# Patient Record
Sex: Female | Born: 1970 | Race: White | Hispanic: No | Smoking: Current every day smoker
Health system: Southern US, Community
[De-identification: ages and names within clinical notes are randomized; demographics above are authoritative.]

## PROBLEM LIST (undated history)

## (undated) DIAGNOSIS — C801 Malignant (primary) neoplasm, unspecified: Secondary | ICD-10-CM

## (undated) DIAGNOSIS — N83209 Unspecified ovarian cyst, unspecified side: Secondary | ICD-10-CM

## (undated) HISTORY — DX: Unspecified ovarian cyst, unspecified side: N83.209

---

## 2017-03-17 ENCOUNTER — Encounter (HOSPITAL_COMMUNITY): Payer: Self-pay | Admitting: Emergency Medicine

## 2017-03-17 ENCOUNTER — Emergency Department (HOSPITAL_COMMUNITY): Payer: Self-pay

## 2017-03-17 ENCOUNTER — Emergency Department (HOSPITAL_COMMUNITY)
Admission: EM | Admit: 2017-03-17 | Discharge: 2017-03-18 | Disposition: A | Discharge status: Home / Self Care | Payer: Self-pay | Attending: Emergency Medicine | Admitting: Emergency Medicine

## 2017-03-17 DIAGNOSIS — Z880 Allergy status to penicillin: Secondary | ICD-10-CM | POA: Insufficient documentation

## 2017-03-17 DIAGNOSIS — M5442 Lumbago with sciatica, left side: Secondary | ICD-10-CM

## 2017-03-17 DIAGNOSIS — X500XXA Overexertion from strenuous movement or load, initial encounter: Secondary | ICD-10-CM | POA: Insufficient documentation

## 2017-03-17 DIAGNOSIS — F17210 Nicotine dependence, cigarettes, uncomplicated: Secondary | ICD-10-CM | POA: Insufficient documentation

## 2017-03-17 DIAGNOSIS — Y929 Unspecified place or not applicable: Secondary | ICD-10-CM | POA: Insufficient documentation

## 2017-03-17 DIAGNOSIS — Y939 Activity, unspecified: Secondary | ICD-10-CM | POA: Insufficient documentation

## 2017-03-17 DIAGNOSIS — N8301 Follicular cyst of right ovary: Secondary | ICD-10-CM | POA: Insufficient documentation

## 2017-03-17 DIAGNOSIS — Y999 Unspecified external cause status: Secondary | ICD-10-CM | POA: Insufficient documentation

## 2017-03-17 DIAGNOSIS — R102 Pelvic and perineal pain: Secondary | ICD-10-CM

## 2017-03-17 DIAGNOSIS — N83201 Unspecified ovarian cyst, right side: Secondary | ICD-10-CM

## 2017-03-17 DIAGNOSIS — S39012A Strain of muscle, fascia and tendon of lower back, initial encounter: Secondary | ICD-10-CM | POA: Insufficient documentation

## 2017-03-17 HISTORY — DX: Malignant (primary) neoplasm, unspecified: C80.1

## 2017-03-17 LAB — CBC
HEMATOCRIT: 44.2 % (ref 36.0–46.0)
HEMOGLOBIN: 14.7 g/dL (ref 12.0–15.0)
MCH: 32.8 pg (ref 26.0–34.0)
MCHC: 33.3 g/dL (ref 30.0–36.0)
MCV: 98.7 fL (ref 78.0–100.0)
Platelets: 153 10*3/uL (ref 150–400)
RBC: 4.48 MIL/uL (ref 3.87–5.11)
RDW: 13.1 % (ref 11.5–15.5)
WBC: 6 10*3/uL (ref 4.0–10.5)

## 2017-03-17 LAB — COMPREHENSIVE METABOLIC PANEL
ALK PHOS: 40 U/L (ref 38–126)
ALT: 27 U/L (ref 14–54)
ANION GAP: 9 (ref 5–15)
AST: 28 U/L (ref 15–41)
Albumin: 4.5 g/dL (ref 3.5–5.0)
BUN: 7 mg/dL (ref 6–20)
CALCIUM: 9.5 mg/dL (ref 8.9–10.3)
CO2: 25 mmol/L (ref 22–32)
Chloride: 105 mmol/L (ref 101–111)
Creatinine, Ser: 0.62 mg/dL (ref 0.44–1.00)
GFR calc non Af Amer: >60 mL/min (ref >60)
Glucose, Bld: 123 mg/dL — ABNORMAL HIGH (ref 65–99)
POTASSIUM: 3.6 mmol/L (ref 3.5–5.1)
SODIUM: 139 mmol/L (ref 135–145)
TOTAL PROTEIN: 6.7 g/dL (ref 6.5–8.1)
Total Bilirubin: 0.5 mg/dL (ref 0.3–1.2)

## 2017-03-17 LAB — URINALYSIS, ROUTINE W REFLEX MICROSCOPIC
Bilirubin Urine: NEGATIVE
Glucose, UA: NEGATIVE mg/dL
Hgb urine dipstick: NEGATIVE
Ketones, ur: NEGATIVE mg/dL
Leukocytes, UA: NEGATIVE
NITRITE: NEGATIVE
Protein, ur: NEGATIVE mg/dL
SPECIFIC GRAVITY, URINE: 1.008 (ref 1.005–1.030)
pH: 8 (ref 5.0–8.0)

## 2017-03-17 LAB — PREGNANCY, URINE: PREG TEST UR: NEGATIVE

## 2017-03-17 LAB — LIPASE, BLOOD: Lipase: 47 U/L (ref 11–51)

## 2017-03-17 MED ORDER — OXYCODONE-ACETAMINOPHEN 5-325 MG PO TABS
ORAL_TABLET | ORAL | Status: AC
  Filled 2017-03-17: qty 1

## 2017-03-17 MED ORDER — IOPAMIDOL (ISOVUE-300) INJECTION 61%
INTRAVENOUS | Status: AC
  Administered 2017-03-17: 100 mL
  Filled 2017-03-17: qty 100

## 2017-03-17 MED ORDER — IBUPROFEN 800 MG PO TABS: 800.0000 mg | ORAL_TABLET | Freq: Three times a day (TID) | ORAL | 0 refills | Status: DC | PRN

## 2017-03-17 MED ORDER — MORPHINE SULFATE (PF) 4 MG/ML IV SOLN
4.0000 mg | Freq: Once | INTRAVENOUS | Status: AC
  Administered 2017-03-17: 4 mg via INTRAVENOUS
  Filled 2017-03-17: qty 1

## 2017-03-17 MED ORDER — HYDROCODONE-ACETAMINOPHEN 5-325 MG PO TABS: 1.0000 | ORAL_TABLET | Freq: Four times a day (QID) | ORAL | 0 refills | Status: DC | PRN

## 2017-03-17 MED ORDER — DIAZEPAM 5 MG PO TABS
5.0000 mg | ORAL_TABLET | Freq: Once | ORAL | Status: AC
  Administered 2017-03-17: 5 mg via ORAL
  Filled 2017-03-17: qty 1

## 2017-03-17 MED ORDER — DIAZEPAM 5 MG PO TABS: 5.0000 mg | ORAL_TABLET | Freq: Three times a day (TID) | ORAL | 0 refills | Status: DC | PRN

## 2017-03-17 MED ORDER — OXYCODONE-ACETAMINOPHEN 5-325 MG PO TABS
1.0000 | ORAL_TABLET | Freq: Once | ORAL | Status: AC
  Administered 2017-03-17: 1 via ORAL

## 2017-03-17 MED ORDER — SODIUM CHLORIDE 0.9 % IV BOLUS (SEPSIS)
1000.0000 mL | Freq: Once | INTRAVENOUS | Status: AC
  Administered 2017-03-17: 1000 mL via INTRAVENOUS

## 2017-03-17 MED ORDER — HYDROMORPHONE HCL 1 MG/ML IJ SOLN
1.0000 mg | Freq: Once | INTRAMUSCULAR | Status: AC
  Administered 2017-03-17: 1 mg via INTRAVENOUS
  Filled 2017-03-17: qty 1

## 2017-03-17 NOTE — ED Notes (Signed)
Friend stated, My friend is in terrible pain. Can you get her something for pain.

## 2017-03-17 NOTE — ED Triage Notes (Signed)
Pt. Stated, Im having lower back pain and lower abdominal pain started 3-4 days ago. No other symptoms.

## 2017-03-17 NOTE — ED Notes (Signed)
Pt states she has had some incontinence of bladder.  Tender to lower back on palpation. States she has had hysterectomy. Pt states no change from the pain medication given

## 2017-03-17 NOTE — ED Notes (Signed)
Patient transported to Ultrasound 

## 2017-03-17 NOTE — ED Notes (Signed)
Called lab to add urine pregnancy

## 2017-03-17 NOTE — ED Provider Notes (Signed)
[facility] DEPT Provider Note   CSN: [id number] Arrival date & time: 03/17/17  1021     History   Chief Complaint Chief Complaint  Patient presents with  . Back Pain  . Abdominal Pain    HPI Norma Pierce is a 46 y.o. female.  HPI Patient presents to the emergency department with back pain and lower abdominal pain.  The patient, states she has feels very anxious as well.  She states that nothing seems make the condition better with movement and palpation make the pain worse.  She states that she did not take any medications prior to arrival.  She states the symptoms started 3 days ago. The patient denies chest pain, shortness of breath, headache,blurred vision, neck pain, fever, cough, weakness, numbness, dizziness, anorexia, edema, nausea, vomiting, diarrhea, rash,  dysuria, hematemesis, bloody stool, near syncope, or syncope. Past Medical History:  Diagnosis Date  . Cancer ([place])     There are no active problems to display for this patient.   History reviewed. No pertinent surgical history.  OB History    No data available       Home Medications    Prior to Admission medications   Medication Sig Start Date End Date Taking? Authorizing Provider  acetaminophen (TYLENOL) 500 MG tablet Take 1,000 mg by mouth every 4 (four) hours as needed (for pain).   Yes [provider]  docusate sodium (COLACE) 100 MG capsule Take 100 mg by mouth at bedtime as needed for mild constipation.   Yes [provider]  ferrous sulfate 325 (65 FE) MG tablet Take 325 mg by mouth daily with breakfast.   Yes [provider]  Magnesium Salicylate Tetrahyd (DOANS EXTRA STRENGTH PO) Take 2 tablets by mouth every 6 (six) hours as needed (for pain).   Yes [provider]  diazepam (VALIUM) 5 MG tablet Take 1 tablet (5 mg total) by mouth every 8 (eight) hours as needed for muscle spasms. 03/17/17   Selenia Mihok, Harrell Gave, PA-C  HYDROcodone-acetaminophen  (NORCO/VICODIN) 5-325 MG tablet Take 1 tablet by mouth every 6 (six) hours as needed for moderate pain. 03/17/17   Yasamin Karel, Harrell Gave, PA-C  ibuprofen (ADVIL,MOTRIN) 800 MG tablet Take 1 tablet (800 mg total) by mouth every 8 (eight) hours as needed. 03/17/17   Dalia Heading, PA-C    Family History No family history on file.  Social History Social History  Substance Use Topics  . Smoking status: Current Every Day Smoker  . Smokeless tobacco: Current User  . Alcohol use No     Allergies   Penicillins   Review of Systems Review of Systems  All other systems negative except as documented in the HPI. All pertinent positives and negatives as reviewed in the HPI. Physical Exam Updated Vital Signs BP (!) 142/113   Pulse (!) 58   Temp 98.3 F (36.8 C) (Oral)   Resp 18   Ht 5' 3.5" (1.613 m)   Wt 41.3 kg (91 lb)   SpO2 100%   BMI 15.87 kg/m   Physical Exam  Constitutional: She is oriented to person, place, and time. She appears well-developed and well-nourished. No distress.  HENT:  Head: Normocephalic and atraumatic.  Mouth/Throat: Oropharynx is clear and moist.  Eyes: Pupils are equal, round, and reactive to light.  Neck: Normal range of motion. Neck supple.  Cardiovascular: Normal rate, regular rhythm and normal heart sounds.  Exam reveals no gallop and no friction rub.   No murmur heard. Pulmonary/Chest: Effort normal and  breath sounds normal. No respiratory distress. She has no wheezes.  Abdominal: Soft. Bowel sounds are normal. She exhibits no distension and no mass. There is tenderness. There is no rebound and no guarding.  Musculoskeletal:       Lumbar back: She exhibits tenderness. She exhibits normal range of motion, no bony tenderness and no swelling.       Back:  Neurological: She is alert and oriented to person, place, and time. She has normal strength and normal reflexes. No sensory deficit. She exhibits normal muscle tone. Coordination and gait normal. GCS  eye subscore is 4. GCS verbal subscore is 5. GCS motor subscore is 6.  Skin: Skin is warm and dry. Capillary refill takes less than 2 seconds. No rash noted. No erythema.  Psychiatric: She has a normal mood and affect. Her behavior is normal.  Nursing note and vitals reviewed.    ED Treatments / Results  Labs (all labs ordered are listed, but only abnormal results are displayed) Labs Reviewed  COMPREHENSIVE METABOLIC PANEL - Abnormal; Notable for the following:       Result Value   Glucose, Bld 123 (*)    All other components within normal limits  URINALYSIS, ROUTINE W REFLEX MICROSCOPIC - Abnormal; Notable for the following:    Color, Urine STRAW (*)    All other components within normal limits  LIPASE, BLOOD  CBC  PREGNANCY, URINE    EKG  EKG Interpretation None       Radiology US Transvaginal Non-ob  Result Date: 03/17/2017 CLINICAL DATA:  Pelvic pain for 4 days. Status post hysterectomy 2012. Follow-up cystic structure and pelvis. EXAM: TRANSABDOMINAL AND TRANSVAGINAL ULTRASOUND OF PELVIS DOPPLER ULTRASOUND OF OVARIES TECHNIQUE: Both transabdominal and transvaginal ultrasound examinations of the pelvis were performed. Transabdominal technique was performed for global imaging of the pelvis including ovaries, adnexal regions, and pelvic cul-de-sac. It was necessary to proceed with endovaginal exam following the transabdominal exam to visualize the adnexa. Color and duplex Doppler ultrasound was utilized to evaluate blood flow to the ovaries. COMPARISON:  CT abdomen and pelvis March 17, 2017 1841 hours FINDINGS: Uterus Surgically absent. Right ovary Measurements: 2.5 x 1.8 x 1.7 cm. Isoechoic 13 x 17 mm intra ovarian mass with increased through transmission, no vascularity. Left ovary Not sonographically identified, likely obscured by bowel gas. Pulsed Doppler evaluation of RIGHT ovary demonstrates normal low-resistance arterial and venous waveforms. Other findings Tubular anechoic  thin walled structure adjacent to RIGHT ovary measuring 4.2 x 3 x 3.8 cm without inflammation. IMPRESSION: 1. Tubular cystic structure RIGHT pelvis seen with hydrosalpinx, less likely pyosalpinx given lack of debris and inflammation. 2. 1.7 cm RIGHT ovarian hemorrhagic cyst versus endometrium. Recommend follow-up pelvic ultrasound in 6-12 weeks to ensure resolution. Electronically Signed   By: Elon Alas M.D.   On: 03/17/2017 21:58   US Pelvis Complete  Result Date: 03/17/2017 CLINICAL DATA:  Pelvic pain for 4 days. Status post hysterectomy 2012. Follow-up cystic structure and pelvis. EXAM: TRANSABDOMINAL AND TRANSVAGINAL ULTRASOUND OF PELVIS DOPPLER ULTRASOUND OF OVARIES TECHNIQUE: Both transabdominal and transvaginal ultrasound examinations of the pelvis were performed. Transabdominal technique was performed for global imaging of the pelvis including ovaries, adnexal regions, and pelvic cul-de-sac. It was necessary to proceed with endovaginal exam following the transabdominal exam to visualize the adnexa. Color and duplex Doppler ultrasound was utilized to evaluate blood flow to the ovaries. COMPARISON:  CT abdomen and pelvis March 17, 2017 1841 hours FINDINGS: Uterus Surgically absent. Right ovary Measurements: 2.5  x 1.8 x 1.7 cm. Isoechoic 13 x 17 mm intra ovarian mass with increased through transmission, no vascularity. Left ovary Not sonographically identified, likely obscured by bowel gas. Pulsed Doppler evaluation of RIGHT ovary demonstrates normal low-resistance arterial and venous waveforms. Other findings Tubular anechoic thin walled structure adjacent to RIGHT ovary measuring 4.2 x 3 x 3.8 cm without inflammation. IMPRESSION: 1. Tubular cystic structure RIGHT pelvis seen with hydrosalpinx, less likely pyosalpinx given lack of debris and inflammation. 2. 1.7 cm RIGHT ovarian hemorrhagic cyst versus endometrium. Recommend follow-up pelvic ultrasound in 6-12 weeks to ensure resolution.  Electronically Signed   By: Elon Alas M.D.   On: 03/17/2017 21:58   Ct Abdomen Pelvis W Contrast  Result Date: 03/17/2017 CLINICAL DATA:  Low back and lower abdominal pain starting 3-4 days ago. EXAM: CT ABDOMEN AND PELVIS WITH CONTRAST TECHNIQUE: Multidetector CT imaging of the abdomen and pelvis was performed using the standard protocol following bolus administration of intravenous contrast. CONTRAST:  114mL ISOVUE-300 IOPAMIDOL (ISOVUE-300) INJECTION 61% COMPARISON:  None. FINDINGS: Lower chest:  No evidence pleural effusion. Hepatobiliary: Several very tiny hypoattenuating liver lesions are too small to characterize but likely benign etiology such as cysts. There is no evidence for gallstones, gallbladder wall thickening, or pericholecystic fluid. No intrahepatic or extrahepatic biliary dilation. Pancreas: No focal mass lesion. No dilatation of the main duct. No intraparenchymal cyst. No peripancreatic edema. Spleen: No splenomegaly. No focal mass lesion. Adrenals/Urinary Tract: No adrenal nodule or mass. Kidneys are unremarkable. No evidence for hydroureter. The urinary bladder appears normal for the degree of distention. Stomach/Bowel: Stomach is nondistended. No gastric wall thickening. No evidence of outlet obstruction. Duodenum is normally positioned as is the ligament of Treitz. No small bowel wall thickening. No small bowel dilatation. The terminal ileum is normal. The appendix is not visualized, but there is no edema or inflammation in the region of the cecum. No gross colonic mass. No colonic wall thickening. No substantial diverticular change. Suture line is noted in the distal rectum near the anal verge. Vascular/Lymphatic: No abdominal aortic aneurysm. There is no gastrohepatic or hepatoduodenal ligament lymphadenopathy. No intraperitoneal or retroperitoneal lymphadenopathy. No pelvic sidewall lymphadenopathy. Reproductive: Uterus is surgically absent. Dilated fluid-filled tubular  structure in the right adnexal region measures up to 2.1 cm diameter. This has a somewhat tortuous configuration and is immediately adjacent to and contiguous with the normal appearing right ovary. Imaging features are most suggestive of a dilated right fallopian tube. Left ovary is normal. Other: There is a trace amount of free fluid in the cul-de-sac. Musculoskeletal: Bone windows reveal no worrisome lytic or sclerotic osseous lesions. IMPRESSION: 1. Dilated fluid-filled tubular structure with tortuous configuration in the right adnexal space is felt to be most consistent with a dilated right fallopian tube (hydrosalpinx). Superinfection of this fluid cannot be excluded by CT (pyosalpinx). Although the appendix is not seen on today's study, this structure has a thinner wall than typically seen for an appendix and there is no adjacent edema or inflammation. The adjacent right ovary is normal by CT. No evidence for abscess. 2. Trace free fluid in the cul-de-sac. 3. Anastomotic suture line noted in the low rectum near the anal verge. Electronically Signed   By: Misty Stanley M.D.   On: 03/17/2017 18:58   [place] Art/ven Flow Abd Pelv Doppler  Result Date: 03/17/2017 CLINICAL DATA:  Pelvic pain for 4 days. Status post hysterectomy 2012. Follow-up cystic structure and pelvis. EXAM: TRANSABDOMINAL AND TRANSVAGINAL ULTRASOUND OF PELVIS DOPPLER ULTRASOUND  OF OVARIES TECHNIQUE: Both transabdominal and transvaginal ultrasound examinations of the pelvis were performed. Transabdominal technique was performed for global imaging of the pelvis including ovaries, adnexal regions, and pelvic cul-de-sac. It was necessary to proceed with endovaginal exam following the transabdominal exam to visualize the adnexa. Color and duplex Doppler ultrasound was utilized to evaluate blood flow to the ovaries. COMPARISON:  CT abdomen and pelvis March 17, 2017 1841 hours FINDINGS: Uterus Surgically absent. Right ovary Measurements: 2.5 x 1.8 x 1.7  cm. Isoechoic 13 x 17 mm intra ovarian mass with increased through transmission, no vascularity. Left ovary Not sonographically identified, likely obscured by bowel gas. Pulsed Doppler evaluation of RIGHT ovary demonstrates normal low-resistance arterial and venous waveforms. Other findings Tubular anechoic thin walled structure adjacent to RIGHT ovary measuring 4.2 x 3 x 3.8 cm without inflammation. IMPRESSION: 1. Tubular cystic structure RIGHT pelvis seen with hydrosalpinx, less likely pyosalpinx given lack of debris and inflammation. 2. 1.7 cm RIGHT ovarian hemorrhagic cyst versus endometrium. Recommend follow-up pelvic ultrasound in 6-12 weeks to ensure resolution. Electronically Signed   By: Elon Alas M.D.   On: 03/17/2017 21:58    Procedures Procedures (including critical care time)  Medications Ordered in ED Medications  oxyCODONE-acetaminophen (PERCOCET/ROXICET) 5-325 MG per tablet 1 tablet (1 tablet Oral Given 03/17/17 1259)  sodium chloride 0.9 % bolus 1,000 mL (0 mLs Intravenous Stopped 03/17/17 2010)  morphine 4 MG/ML injection 4 mg (4 mg Intravenous Given 03/17/17 1856)  iopamidol (ISOVUE-300) 61 % injection (100 mLs  Contrast Given 03/17/17 1831)  HYDROmorphone (DILAUDID) injection 1 mg (1 mg Intravenous Given 03/17/17 2050)  diazepam (VALIUM) tablet 5 mg (5 mg Oral Given 03/17/17 2054)     Initial Impression / Assessment and Plan / ED Course  I have reviewed the triage vital signs and the nursing notes.  Pertinent labs & imaging results that were available during my care of the patient were reviewed by me and considered in my medical decision making (see chart for details).     , And has an ovarian cyst and hydrosalpinx.  She also has a lot of feels a muscular strain of her back due to the fact that is palpable in nature, not in the midline.  Patient has no recent trauma.  Explained the importance of follow-up with the GYN doctor.  The patient agrees the plan and all questions were  answered.  Patient is advised to return here for any worsening in her condition.  I did advise her that this could be an evolving process.    She seems very anxious on my examination, she reacts very boisterously to the blood pressure cuff inflating on her arm.  Patient is feeling better following pain medications advised her to feel this is multifactorial and told to return here as needed  Final Clinical Impressions(s) / ED Diagnoses   Final diagnoses:  Cyst of right ovary  Pelvic pain in female  Acute left-sided low back pain with left-sided sciatica  Strain of lumbar region, initial encounter    New Prescriptions New Prescriptions   DIAZEPAM (VALIUM) 5 MG TABLET    Take 1 tablet (5 mg total) by mouth every 8 (eight) hours as needed for muscle spasms.   HYDROCODONE-ACETAMINOPHEN (NORCO/VICODIN) 5-325 MG TABLET    Take 1 tablet by mouth every 6 (six) hours as needed for moderate pain.   IBUPROFEN (ADVIL,MOTRIN) 800 MG TABLET    Take 1 tablet (800 mg total) by mouth every 8 (eight) hours as needed.  Dalia Heading, PA-C 69/45/03 [id number]    Delora Fuel, MD 80/03/49 [phone]

## 2017-03-17 NOTE — Discharge Instructions (Signed)
Use ice and heat on your back.  Return here as needed.  Follow-up with the clinic provided

## 2017-03-17 NOTE — ED Notes (Signed)
Pt stated, at least I can breath.

## 2017-03-18 ENCOUNTER — Telehealth: Payer: Self-pay | Admitting: Emergency Medicine

## 2017-03-18 NOTE — Telephone Encounter (Signed)
Pt's spouse, Norma Pierce called with concern for continued pain after being seen at [facility] last night.  Reviewed chart, noting pt was discharged with pain medications and has a follow up appointment scheduled for March 20, 2017 at 1350.  Returned call and spoke with Norma Pierce who stated they filled the prescriptions but pt is continuing to have pain and he was unable to get a follow up appointment in the next several days.  Advised him an appointment was scheduled and to call them to confirm.  Discussed medications and advised to also contact the pharmacist if he has concerns for medication interactions.  No additional CM needs noted at this time.

## 2017-03-20 ENCOUNTER — Encounter: Payer: Self-pay | Admitting: Obstetrics & Gynecology

## 2017-03-21 ENCOUNTER — Encounter: Payer: Self-pay | Admitting: Obstetrics and Gynecology

## 2017-03-21 ENCOUNTER — Ambulatory Visit (INDEPENDENT_AMBULATORY_CARE_PROVIDER_SITE_OTHER): Payer: Self-pay | Admitting: Obstetrics and Gynecology

## 2017-03-21 VITALS — BP 128/88 | Ht 63.0 in | Wt 86.0 lb

## 2017-03-21 DIAGNOSIS — N7011 Chronic salpingitis: Secondary | ICD-10-CM

## 2017-03-21 DIAGNOSIS — N83201 Unspecified ovarian cyst, right side: Secondary | ICD-10-CM

## 2017-03-21 DIAGNOSIS — Z8541 Personal history of malignant neoplasm of cervix uteri: Secondary | ICD-10-CM | POA: Insufficient documentation

## 2017-03-21 NOTE — Progress Notes (Addendum)
Obstetrics & Gynecology Office Visit   Chief Complaint  Patient presents with  . Follow-up    ER Follow Up   History of Present Illness: 46 y.o. [id number] female who presents in follow up from an ER visit to [facility] on 03/17/17.  She states that she had intercourse on last Thursday (8 days ago) night and when she woke up on Friday morning she had a lot of left back pain. This pain continued through the weekend and on Sunday she could barely move. Therefore, on Monday she presented to the emergency room. Her workup was notable for extreme tenderness along her left paraspinous muscles. A CT of the abdomen and pelvis showed a right pelvic tubular structure, possibly consistent with a hydrosalpinx. A pelvic ultrasound confirmed the same and showed a right ovarian cyst with maximal diameter of 1.7 cm. She was diagnosed also with the urinary tract infection and given appropriate antibiotics.  The patient has a somewhat confusing history. She states that in 2012 she underwent a hysterectomy due to cervical cancer. It has been her understanding that she had all of her reproductive organs removed. This includes her uterus, her cervix, her ovaries, and both fallopian tubes. Shortly after her surgery she moved to a different part of [place]. She states that she had follow-up with a gynecologist near [place]. She has only recently moved to this area. She states that she has been doing a lot of work on a new home. She also states that in addition to her back pain she has pain in her bilateral groin area.  She denies any vaginal bleeding. She states that she has lost about 10 pounds unintentionally. Denies bloating, constipation, early satiety. Her current pain is of acute onset. She states that she saw her primary care doctor this morning and was given medication for muscle spasms, which she states has helped somewhat.  Past Medical History:  Diagnosis Date  . Cancer ([place])   . Ovarian cyst    Past  Surgical History: total laparoscopic hysterectomy, bilateral salpingo-oophorectomy, 2012 due to cervical cancer.  Gynecologic History: No LMP recorded. Patient has had a hysterectomy.  Obstetric History: [id number]  Family History  Problem Relation Age of Onset  . Lung cancer Mother   . Diabetes Mellitus II Sister   . Skin cancer Sister   . Breast cancer Maternal Aunt   . Hypertension Maternal Aunt   . COPD Maternal Aunt   . Skin cancer Maternal Aunt   . Fibromyalgia Maternal Aunt   . Brain cancer Maternal Uncle   . Aneurysm Maternal Grandmother   . Heart attack Maternal Grandfather     Social History   Social History  . Marital status: Married    Spouse name: N/A  . Number of children: N/A  . Years of education: N/A   Occupational History  . Not on file.   Social History Main Topics  . Smoking status: Current Every Day Smoker  . Smokeless tobacco: Current User  . Alcohol use No  . Drug use: No  . Sexual activity: Not Currently   Other Topics Concern  . Not on file   Social History Narrative  . No narrative on file    Allergies  Allergen Reactions  . Penicillins Anaphylaxis and Hives    Has patient had a PCN reaction causing immediate rash, facial/tongue/throat swelling, SOB or lightheadedness with hypotension: Yes Has patient had a PCN reaction causing severe rash involving mucus membranes or skin necrosis: No Has patient  had a PCN reaction that required hospitalization: Yes Has patient had a PCN reaction occurring within the last 10 years: No If all of the above answers are "NO", then may proceed with Cephalosporin use.     Prior to Admission medications   Medication Sig Start Date End Date Taking? Authorizing Provider  acetaminophen (TYLENOL) 500 MG tablet Take 1,000 mg by mouth every 4 (four) hours as needed (for pain).    [provider]  diazepam (VALIUM) 5 MG tablet Take 1 tablet (5 mg total) by mouth every 8 (eight) hours as needed for muscle  spasms. 03/17/17   Lawyer, Harrell Gave, PA-C  docusate sodium (COLACE) 100 MG capsule Take 100 mg by mouth at bedtime as needed for mild constipation.    [provider]  ferrous sulfate 325 (65 FE) MG tablet Take 325 mg by mouth daily with breakfast.    [provider]  HYDROcodone-acetaminophen (NORCO/VICODIN) 5-325 MG tablet Take 1 tablet by mouth every 6 (six) hours as needed for moderate pain. 03/17/17   Lawyer, Harrell Gave, PA-C  ibuprofen (ADVIL,MOTRIN) 800 MG tablet Take 1 tablet (800 mg total) by mouth every 8 (eight) hours as needed. 03/17/17   Lawyer, Harrell Gave, PA-C  Magnesium Salicylate Tetrahyd (DOANS EXTRA STRENGTH PO) Take 2 tablets by mouth every 6 (six) hours as needed (for pain).    [provider]    Review of Systems  Constitutional: Positive for weight loss. Negative for chills, diaphoresis, fever and malaise/fatigue.  HENT: Negative.   Eyes: Negative.   Respiratory: Negative.  Negative for cough, hemoptysis and wheezing.   Cardiovascular: Negative.  Negative for leg swelling.  Gastrointestinal: Positive for abdominal pain (as noted in the HPI). Negative for blood in stool, constipation, diarrhea, melena, nausea and vomiting.  Genitourinary: Positive for dysuria and flank pain. Negative for frequency, hematuria and urgency.  Musculoskeletal: Positive for back pain. Negative for falls, joint pain and neck pain.  Skin: Negative.   Neurological: Negative.  Negative for weakness.  Psychiatric/Behavioral: Negative.     Physical Exam BP 128/88   Ht 5\' 3"  (1.6 m)   Wt 86 lb (39 kg)   BMI 15.23 kg/m  No LMP recorded. Patient has had a hysterectomy. Physical Exam  Constitutional: She is oriented to person, place, and time.  Thin woman, in no apparent distress  Genitourinary: Vagina normal. Pelvic exam was performed with patient supine. There is no rash, tenderness, lesion or injury on the right labia. There is no rash, tenderness, lesion or injury on  the left labia. Vagina exhibits no lesion. No erythema, tenderness or bleeding in the vagina. No signs of injury around the vagina.  Right adnexum displays tenderness. Right adnexum does not display mass and does not display fullness.  Left adnexum displays tenderness. Left adnexum does not display mass and does not display fullness.  Genitourinary Comments: Speculum exam shows normal vagina with estrogen effect. No cervix visualized. No lesions noted.  TTP in bilateral adnexa. No solid masses palpated.  HENT:  Head: Normocephalic and atraumatic.  Eyes: EOM are normal. No scleral icterus.  Neck: Normal range of motion. Neck supple. No thyromegaly present.  Cardiovascular: Normal rate and regular rhythm.  Exam reveals no gallop and no friction rub.   No murmur heard. Pulmonary/Chest: Effort normal and breath sounds normal. No respiratory distress. She has no wheezes. She has no rales.  Abdominal: Soft. Bowel sounds are normal. She exhibits no distension and no mass. There is tenderness (in bilatral inguinal ligaments). There  is no rebound and no guarding. No hernia.  Musculoskeletal: Normal range of motion. She exhibits tenderness (left paraspinous muslces from mid thoracic region to ilium). She exhibits no edema.  Lymphadenopathy:    She has no cervical adenopathy.  Neurological: She is alert and oriented to person, place, and time. No cranial nerve deficit.  Skin: Skin is warm and dry. No erythema.  Psychiatric: She has a normal mood and affect. Her behavior is normal. Judgment normal.   Female chaperone present for pelvic and breast  portions of the physical exam  Assessment: 46 y.o. [id number] female with back pain and likely sciatic pain with bilateral inguinal pain.  Plan: Since her history is unclear, I would like to try to obtain documentation from [facility] in [place]. Based on her imaging at the emergency room, it appears she has at least the right ovary and possibly the  remnant of a right fallopian tube. It is also possible that the tubular structure seen on CT and ultrasound is a peritoneal inclusion cyst. However given that ovarian tissue was seen on both imaging modalities, the diagnosis of cervical cancer is called into question. If she does have cervical cancer, a sudden onset of pain along with unexplained weight loss gives me concerned for metastatic recurrent disease. I have performed a Pap smear to ensure there is no vaginal, microscopic evidence of recurrent disease. However, given that she had positive findings on ultrasound that appear benign, I will repeat the ultrasound in 2-3 months. Follow-up could change if review of her records from [place] indicate cancer. Treatment for pain per her PCP.  Prentice Docker, MD 03/21/2017 2:14 PM    ADDENDUM: Pap smear result is normal.

## 2017-03-27 LAB — PAP IG (IMAGE GUIDED): PAP SMEAR COMMENT: 0

## 2017-04-09 ENCOUNTER — Ambulatory Visit: Payer: Self-pay | Admitting: Obstetrics and Gynecology

## 2017-04-09 ENCOUNTER — Other Ambulatory Visit: Payer: Self-pay

## 2017-04-24 ENCOUNTER — Ambulatory Visit (INDEPENDENT_AMBULATORY_CARE_PROVIDER_SITE_OTHER): Payer: Self-pay | Admitting: Obstetrics and Gynecology

## 2017-04-24 ENCOUNTER — Ambulatory Visit (INDEPENDENT_AMBULATORY_CARE_PROVIDER_SITE_OTHER): Payer: Self-pay

## 2017-04-24 ENCOUNTER — Encounter: Payer: Self-pay | Admitting: Obstetrics and Gynecology

## 2017-04-24 VITALS — BP 108/68 | Wt 89.0 lb

## 2017-04-24 DIAGNOSIS — N7011 Chronic salpingitis: Secondary | ICD-10-CM

## 2017-04-24 DIAGNOSIS — N83201 Unspecified ovarian cyst, right side: Secondary | ICD-10-CM

## 2017-04-24 DIAGNOSIS — Z8541 Personal history of malignant neoplasm of cervix uteri: Secondary | ICD-10-CM

## 2017-04-24 NOTE — Progress Notes (Signed)
Gynecology Ultrasound Follow Up   Chief Complaint  Patient presents with  . Follow-up  pain and right para-ovarian cyst   History of Present Illness: Patient is a 46 y.o. female who presents today for ultrasound evaluation of right adnexal process.  Ultrasound demonstrates the following findings Adnexa: right tubular structure 1.76 x 2.11 cm, right ovary visualized with possible endometrioma vs cystic structure measuring 1.95 x 1.5 cm Uterus: absent Additional: absent pelvic free fluid  Patient reports improvement in pain symptoms.   Past Medical History:  Diagnosis Date  . Cancer ([place])   . Ovarian cyst    Past Surgical History: total laparoscopic hysterectomy, bilateral salpingo-oophrectomy due to cervical cancer, lysis of adhesions  Family History  Problem Relation Age of Onset  . Lung cancer Mother   . Diabetes Mellitus II Sister   . Skin cancer Sister   . Breast cancer Maternal Aunt   . Hypertension Maternal Aunt   . COPD Maternal Aunt   . Skin cancer Maternal Aunt   . Fibromyalgia Maternal Aunt   . Brain cancer Maternal Uncle   . Aneurysm Maternal Grandmother   . Heart attack Maternal Grandfather     Social History   Social History  . Marital status: Married    Spouse name: N/A  . Number of children: N/A  . Years of education: N/A   Occupational History  . Not on file.   Social History Main Topics  . Smoking status: Current Every Day Smoker  . Smokeless tobacco: Current User  . Alcohol use No  . Drug use: No  . Sexual activity: Not Currently   Other Topics Concern  . Not on file   Social History Narrative  . No narrative on file    Allergies  Allergen Reactions  . Penicillins Anaphylaxis and Hives    Has patient had a PCN reaction causing immediate rash, facial/tongue/throat swelling, SOB or lightheadedness with hypotension: Yes Has patient had a PCN reaction causing severe rash involving mucus membranes or skin necrosis: No Has patient  had a PCN reaction that required hospitalization: Yes Has patient had a PCN reaction occurring within the last 10 years: No If all of the above answers are "NO", then may proceed with Cephalosporin use.     Prior to Admission medications   Medication Sig Start Date End Date Taking? Authorizing Provider  acetaminophen (TYLENOL) 500 MG tablet Take 1,000 mg by mouth every 4 (four) hours as needed (for pain).    [provider]  diazepam (VALIUM) 5 MG tablet Take 1 tablet (5 mg total) by mouth every 8 (eight) hours as needed for muscle spasms. 03/17/17   Lawyer, Harrell Gave, PA-C  docusate sodium (COLACE) 100 MG capsule Take 100 mg by mouth at bedtime as needed for mild constipation.    [provider]  ferrous sulfate 325 (65 FE) MG tablet Take 325 mg by mouth daily with breakfast.    [provider]  HYDROcodone-acetaminophen (NORCO/VICODIN) 5-325 MG tablet Take 1 tablet by mouth every 6 (six) hours as needed for moderate pain. 03/17/17   Lawyer, Harrell Gave, PA-C  ibuprofen (ADVIL,MOTRIN) 800 MG tablet Take 1 tablet (800 mg total) by mouth every 8 (eight) hours as needed. 03/17/17   Lawyer, Harrell Gave, PA-C  Magnesium Salicylate Tetrahyd (DOANS EXTRA STRENGTH PO) Take 2 tablets by mouth every 6 (six) hours as needed (for pain).    [provider]    Physical Exam BP 108/68   Wt 89 lb (40.4 kg)  BMI 15.77 kg/m    General: NAD HEENT: normocephalic, anicteric Pulmonary: No increased work of breathing Extremities: no edema, erythema, or tenderness Neurologic: Grossly intact, normal gait Psychiatric: mood appropriate, affect full   Assessment: 47 y.o. [id number] with possible history of cervical cancer status post TLH/BSO with right hydrosalpinx and right ovarian cyst.  Pain improved. Still no records from [place].   Plan: Problem List Items Addressed This Visit    Hydrosalpinx - Primary   Right ovarian cyst   History of cervical cancer     Discussed  these findings in the setting of possible history of cervical cancer.  Given her age and the timing of her surgery, I would have expected to her to have no ovary on the right side nor a fallopian tube. She further states that in [facility] she had another surgery for lysis of adhesions.  I have still not received records from either place.  Discussed that if she truly had cancer, then I might consider referring her to gyn oncology. Otherwise, we could continue to watch this very small process.  Given her extensive surgical history, I would not want to operate unless absolutely necessary.  Her hydrosalpinx is more like a result of closed fallopian tube ends than an infectious process given her surgical history.  Follow up prn or, if after review of records (She states that she will retrieve them herself) it is determined she needs further workup, consider referral to gynecologic oncology.   20 minutes spent in face to face discussion with > 50% spent in counseling and management of her right hydrosalpinx and right ovarian follicle vs cyst.   Prentice Docker, MD 04/25/2017 2:54 PM

## 2019-03-10 ENCOUNTER — Telehealth: Payer: Self-pay | Admitting: *Deleted

## 2019-03-10 ENCOUNTER — Other Ambulatory Visit: Payer: Self-pay

## 2019-03-10 DIAGNOSIS — Z20822 Contact with and (suspected) exposure to covid-19: Secondary | ICD-10-CM

## 2019-03-10 NOTE — Telephone Encounter (Signed)
Referred by [facility] due to exposure. Appointment made for today at [facility] site for 1:30p Informed to wear mask and stay in vehicle.

## 2019-03-17 LAB — NOVEL CORONAVIRUS, NAA: SARS-CoV-2, NAA: NOT DETECTED

## 2020-03-12 ENCOUNTER — Emergency Department
Admission: EM | Admit: 2020-03-12 | Discharge: 2020-03-12 | Disposition: A | Discharge status: Home / Self Care | Payer: Medicaid Other | Attending: Emergency Medicine | Admitting: Emergency Medicine

## 2020-03-12 ENCOUNTER — Other Ambulatory Visit: Payer: Self-pay

## 2020-03-12 ENCOUNTER — Emergency Department: Payer: Medicaid Other

## 2020-03-12 ENCOUNTER — Encounter: Payer: Self-pay | Admitting: Emergency Medicine

## 2020-03-12 DIAGNOSIS — Y999 Unspecified external cause status: Secondary | ICD-10-CM | POA: Insufficient documentation

## 2020-03-12 DIAGNOSIS — Y939 Activity, unspecified: Secondary | ICD-10-CM | POA: Diagnosis not present

## 2020-03-12 DIAGNOSIS — S0992XA Unspecified injury of nose, initial encounter: Secondary | ICD-10-CM | POA: Diagnosis present

## 2020-03-12 DIAGNOSIS — S022XXA Fracture of nasal bones, initial encounter for closed fracture: Secondary | ICD-10-CM | POA: Insufficient documentation

## 2020-03-12 DIAGNOSIS — F17200 Nicotine dependence, unspecified, uncomplicated: Secondary | ICD-10-CM | POA: Diagnosis not present

## 2020-03-12 DIAGNOSIS — W228XXA Striking against or struck by other objects, initial encounter: Secondary | ICD-10-CM | POA: Diagnosis not present

## 2020-03-12 DIAGNOSIS — Y929 Unspecified place or not applicable: Secondary | ICD-10-CM | POA: Diagnosis not present

## 2020-03-12 DIAGNOSIS — S0511XA Contusion of eyeball and orbital tissues, right eye, initial encounter: Secondary | ICD-10-CM

## 2020-03-12 DIAGNOSIS — S0011XA Contusion of right eyelid and periocular area, initial encounter: Secondary | ICD-10-CM | POA: Insufficient documentation

## 2020-03-12 LAB — BASIC METABOLIC PANEL
Anion gap: 7 (ref 5–15)
BUN: 11 mg/dL (ref 6–20)
CO2: 27 mmol/L (ref 22–32)
Calcium: 9.3 mg/dL (ref 8.9–10.3)
Chloride: 108 mmol/L (ref 98–111)
Creatinine, Ser: 0.58 mg/dL (ref 0.44–1.00)
GFR calc Af Amer: >60 mL/min (ref >60)
GFR calc non Af Amer: >60 mL/min (ref >60)
Glucose, Bld: 75 mg/dL (ref 70–99)
Potassium: 4 mmol/L (ref 3.5–5.1)
Sodium: 142 mmol/L (ref 135–145)

## 2020-03-12 LAB — CBC WITH DIFFERENTIAL/PLATELET
Abs Immature Granulocytes: 0.01 10*3/uL (ref 0.00–0.07)
Basophils Absolute: 0.1 10*3/uL (ref 0.0–0.1)
Basophils Relative: 1 %
Eosinophils Absolute: 0.1 10*3/uL (ref 0.0–0.5)
Eosinophils Relative: 1 %
HCT: 43.2 % (ref 36.0–46.0)
Hemoglobin: 14.9 g/dL (ref 12.0–15.0)
Immature Granulocytes: 0 %
Lymphocytes Relative: 28 %
Lymphs Abs: 1.8 10*3/uL (ref 0.7–4.0)
MCH: 32.8 pg (ref 26.0–34.0)
MCHC: 34.5 g/dL (ref 30.0–36.0)
MCV: 95.2 fL (ref 80.0–100.0)
Monocytes Absolute: 0.3 10*3/uL (ref 0.1–1.0)
Monocytes Relative: 5 %
Neutro Abs: 4.2 10*3/uL (ref 1.7–7.7)
Neutrophils Relative %: 65 %
Platelets: 206 10*3/uL (ref 150–400)
RBC: 4.54 MIL/uL (ref 3.87–5.11)
RDW: 13.5 % (ref 11.5–15.5)
WBC: 6.4 10*3/uL (ref 4.0–10.5)
nRBC: 0 % (ref 0.0–0.2)

## 2020-03-12 MED ORDER — IOHEXOL 300 MG/ML  SOLN
75.0000 mL | Freq: Once | INTRAMUSCULAR | Status: AC | PRN
  Administered 2020-03-12: 75 mL via INTRAVENOUS

## 2020-03-12 MED ORDER — FLUORESCEIN SODIUM 1 MG OP STRP
1.0000 | ORAL_STRIP | Freq: Once | OPHTHALMIC | Status: AC
  Administered 2020-03-12: 1 via OPHTHALMIC
  Filled 2020-03-12: qty 1

## 2020-03-12 MED ORDER — OXYCODONE HCL 5 MG PO TABS
5.0000 mg | ORAL_TABLET | Freq: Once | ORAL | Status: AC
  Administered 2020-03-12: 5 mg via ORAL
  Filled 2020-03-12: qty 1

## 2020-03-12 MED ORDER — TETRACAINE HCL 0.5 % OP SOLN
2.0000 [drp] | Freq: Once | OPHTHALMIC | Status: AC
  Administered 2020-03-12: 2 [drp] via OPHTHALMIC
  Filled 2020-03-12: qty 4

## 2020-03-12 MED ORDER — PREDNISOLONE ACETATE 1 % OP SUSP: 1.0000 [drp] | Freq: Four times a day (QID) | OPHTHALMIC | 0 refills | Status: DC

## 2020-03-12 MED ORDER — HYDROCODONE-ACETAMINOPHEN 5-325 MG PO TABS: 1.0000 | ORAL_TABLET | Freq: Four times a day (QID) | ORAL | 0 refills | Status: AC | PRN

## 2020-03-12 NOTE — ED Notes (Signed)
Visual Acuity:  R eye- 20/50 L eye- 20/40

## 2020-03-12 NOTE — Discharge Instructions (Signed)
Please go to Dr. Inda Coke office at noon tomorrow.  When you arrive, go to the right side of the building to the door marked "After Hours."   Use the drops as prescribed.
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
## 2020-12-20 ENCOUNTER — Encounter: Payer: Self-pay | Admitting: Emergency Medicine

## 2020-12-20 ENCOUNTER — Other Ambulatory Visit: Payer: Self-pay

## 2020-12-20 ENCOUNTER — Emergency Department
Admission: EM | Admit: 2020-12-20 | Discharge: 2020-12-20 | Disposition: A | Discharge status: Home / Self Care | Payer: Medicaid Other | Attending: Emergency Medicine | Admitting: Emergency Medicine

## 2020-12-20 ENCOUNTER — Emergency Department: Payer: Medicaid Other

## 2020-12-20 DIAGNOSIS — Z8541 Personal history of malignant neoplasm of cervix uteri: Secondary | ICD-10-CM | POA: Insufficient documentation

## 2020-12-20 DIAGNOSIS — S61452A Open bite of left hand, initial encounter: Secondary | ICD-10-CM | POA: Diagnosis not present

## 2020-12-20 DIAGNOSIS — W540XXA Bitten by dog, initial encounter: Secondary | ICD-10-CM | POA: Insufficient documentation

## 2020-12-20 DIAGNOSIS — S6992XA Unspecified injury of left wrist, hand and finger(s), initial encounter: Secondary | ICD-10-CM | POA: Diagnosis present

## 2020-12-20 DIAGNOSIS — S61519A Laceration without foreign body of unspecified wrist, initial encounter: Secondary | ICD-10-CM | POA: Diagnosis not present

## 2020-12-20 DIAGNOSIS — F17200 Nicotine dependence, unspecified, uncomplicated: Secondary | ICD-10-CM | POA: Diagnosis not present

## 2020-12-20 MED ORDER — HYDROCODONE-ACETAMINOPHEN 5-325 MG PO TABS: 1.0000 | ORAL_TABLET | ORAL | 0 refills | Status: DC | PRN

## 2020-12-20 MED ORDER — OXYCODONE-ACETAMINOPHEN 5-325 MG PO TABS
1.0000 | ORAL_TABLET | Freq: Once | ORAL | Status: AC
  Administered 2020-12-20: 1 via ORAL
  Filled 2020-12-20: qty 1

## 2020-12-20 MED ORDER — DOXYCYCLINE HYCLATE 100 MG PO TABS
100.0000 mg | ORAL_TABLET | Freq: Two times a day (BID) | ORAL | 0 refills | Status: DC
Start: 2020-12-20 — End: 2021-10-26

## 2020-12-20 NOTE — ED Provider Notes (Signed)
[facility] Emergency Department Provider Note  ____________________________________________  Time seen: Approximately 9:18 PM  I have reviewed the triage vital signs and the nursing notes.   HISTORY  Chief Complaint Animal Bite    HPI Norma Pierce is a 50 y.o. female who presents the emergency department complaining of dog bite to the left hand.  Patient states that her 2 dogs were playing, she reached into grabbed a toy that they were fighting over and was accidentally bit on her hand.  Patient states that the animals are indoor only, up-to-date on all other immunizations including rabies.  Patient is up-to-date on her tetanus immunization.  She presents with 2 small puncture wounds to the thenar eminence and one small superficial laceration to the anterior wrist.  Bleeding was controlled with direct pressure prior to arrival.  No other complaints at this time.         Past Medical History:  Diagnosis Date  . Cancer ([place])    cervical  . Ovarian cyst     Patient Active Problem List   Diagnosis Date Noted  . Hydrosalpinx 03/21/2017  . Right ovarian cyst 03/21/2017  . History of cervical cancer 03/21/2017    History reviewed. No pertinent surgical history.  Prior to Admission medications   Medication Sig Start Date End Date Taking? Authorizing Provider  doxycycline (VIBRA-TABS) 100 MG tablet Take 1 tablet (100 mg total) by mouth 2 (two) times daily. 12/20/20  Yes Lavin Petteway, Charline Bills, PA-C  HYDROcodone-acetaminophen (NORCO/VICODIN) 5-325 MG tablet Take 1 tablet by mouth every 4 (four) hours as needed for moderate pain. 12/20/20 12/20/21 Yes Dynesha Woolen, Charline Bills, PA-C  acetaminophen (TYLENOL) 500 MG tablet Take 1,000 mg by mouth every 4 (four) hours as needed (for pain).    [provider]  diazepam (VALIUM) 5 MG tablet Take 1 tablet (5 mg total) by mouth every 8 (eight) hours as needed for muscle spasms. 03/17/17   Lawyer, Harrell Gave, PA-C   docusate sodium (COLACE) 100 MG capsule Take 100 mg by mouth at bedtime as needed for mild constipation.    [provider]  ferrous sulfate 325 (65 FE) MG tablet Take 325 mg by mouth daily with breakfast.    [provider]  ibuprofen (ADVIL,MOTRIN) 800 MG tablet Take 1 tablet (800 mg total) by mouth every 8 (eight) hours as needed. 03/17/17   Lawyer, Harrell Gave, PA-C  Magnesium Salicylate Tetrahyd (DOANS EXTRA STRENGTH PO) Take 2 tablets by mouth every 6 (six) hours as needed (for pain).    [provider]  prednisoLONE acetate (PRED FORTE) 1 % ophthalmic suspension Place 1 drop into the right eye 4 (four) times daily. 03/12/20   Victorino Dike, FNP    Allergies Penicillins  Family History  Problem Relation Age of Onset  . Lung cancer Mother   . Diabetes Mellitus II Sister   . Skin cancer Sister   . Breast cancer Maternal Aunt   . Hypertension Maternal Aunt   . COPD Maternal Aunt   . Skin cancer Maternal Aunt   . Fibromyalgia Maternal Aunt   . Brain cancer Maternal Uncle   . Aneurysm Maternal Grandmother   . Heart attack Maternal Grandfather     Social History Social History   Tobacco Use  . Smoking status: Current Every Day Smoker  . Smokeless tobacco: Current User  Substance Use Topics  . Alcohol use: No  . Drug use: No     Review of Systems  Constitutional: No fever/chills  Eyes: No visual changes. No discharge ENT: No upper respiratory complaints. Cardiovascular: no chest pain. Respiratory: no cough. No SOB. Gastrointestinal: No abdominal pain.  No nausea, no vomiting.  No diarrhea.  No constipation. Musculoskeletal: Dog bite to left hand Skin: Negative for rash, abrasions, lacerations, ecchymosis. Neurological: Negative for headaches, focal weakness or numbness.  10 System ROS otherwise negative.  ____________________________________________   PHYSICAL EXAM:  VITAL SIGNS: ED Triage Vitals  Enc Vitals Group     BP 12/20/20  1856 (!) 180/113     Pulse Rate 12/20/20 1856 83     Resp 12/20/20 1856 18     Temp 12/20/20 1856 98.6 F (37 C)     Temp Source 12/20/20 1856 Oral     SpO2 12/20/20 1856 97 %     Weight 12/20/20 1857 96 lb (43.5 kg)     Height 12/20/20 1857 5\' 3"  (1.6 m)     Head Circumference --      Peak Flow --      Pain Score 12/20/20 1857 10     Pain Loc --      Pain Edu? --      Excl. in [place]? --      Constitutional: Alert and oriented. Well appearing and in no acute distress. Eyes: Conjunctivae are normal. PERRL. EOMI. Head: Atraumatic. ENT:      Ears:       Nose: No congestion/rhinnorhea.      Mouth/Throat: Mucous membranes are moist.  Neck: No stridor.    Cardiovascular: Normal rate, regular rhythm. Normal S1 and S2.  Good peripheral circulation. Respiratory: Normal respiratory effort without tachypnea or retractions. Lungs CTAB. Good air entry to the bases with no decreased or absent breath sounds. Musculoskeletal: Full range of motion to all extremities. No gross deformities appreciated.  Visualization of the left hand reveals 2 puncture wounds over the thenar eminence and a superficial laceration along the anterior wrist.  No bleeding.  No visible foreign body.  Full range of motion to all digits.  Patient is very tender over the wounds but no palpable abnormality.  Capillary refill intact all digits.  Sensation intact all digits. Neurologic:  Normal speech and language. No gross focal neurologic deficits are appreciated.  Skin:  Skin is warm, dry and intact. No rash noted. Psychiatric: Mood and affect are normal. Speech and behavior are normal. Patient exhibits appropriate insight and judgement.   ____________________________________________   LABS (all labs ordered are listed, but only abnormal results are displayed)  Labs Reviewed - No data to display ____________________________________________  EKG   ____________________________________________  RADIOLOGY I personally  viewed and evaluated these images as part of my medical decision making, as well as reviewing the written report by the radiologist.  ED Provider Interpretation: No underlying osseous trauma.  No retained foreign body  DG Hand Complete Left  Result Date: 12/20/2020 CLINICAL DATA:  Status post dog bite. EXAM: LEFT HAND - COMPLETE 3+ VIEW COMPARISON:  None. FINDINGS: There is no evidence of fracture or dislocation. There is no evidence of arthropathy or other focal bone abnormality. In ill-defined soft tissue defect is seen along the dorsal aspect of the left hand. IMPRESSION: No evidence of an acute osseous abnormality. Electronically Signed   By: Virgina Norfolk M.D.   On: 12/20/2020 19:17    ____________________________________________    PROCEDURES  Procedure(s) performed:    Procedures    Medications  oxyCODONE-acetaminophen (PERCOCET/ROXICET) 5-325 MG per tablet 1 tablet (1 tablet Oral Given 12/20/20  2147)     ____________________________________________   INITIAL IMPRESSION / ASSESSMENT AND PLAN / ED COURSE  Pertinent labs & imaging results that were available during my care of the patient were reviewed by me and considered in my medical decision making (see chart for details).  Review of the [place] CSRS was performed in accordance of the [place] prior to dispensing any controlled drugs.           Patient's diagnosis is consistent with dog bite to the left hand.  Patient presented to the emergency department after sustaining a dog bite to the left hand.  Relatively superficial wounds.  No retained foreign body and no underlying fractures.  All areas were thoroughly cleansed with copious amounts of saline and Surgicept surgical cleaner.  No concern for rabies as these are the patient's personal animals, they are kept inside and are up-to-date on their rabies vaccine.  They were acting normally and the patient ranged between them while they are playing.  She was up-to-date on  tetanus immunization.  At this time again wounds were cleansed, dressed and patient will be discharged with antibiotics prophylactically.  Wound care instructions discussed with the patient.  Follow-up with primary care as needed..  Patient is given ED precautions to return to the ED for any worsening or new symptoms.     ____________________________________________  FINAL CLINICAL IMPRESSION(S) / ED DIAGNOSES  Final diagnoses:  Dog bite, initial encounter      NEW MEDICATIONS STARTED DURING THIS VISIT:  ED Discharge Orders         Ordered    doxycycline (VIBRA-TABS) 100 MG tablet  2 times daily        12/20/20 2136    HYDROcodone-acetaminophen (NORCO/VICODIN) 5-325 MG tablet  Every 4 hours PRN        12/20/20 2136              This chart was dictated using voice recognition software/Dragon. Despite best efforts to proofread, errors can occur which can change the meaning. Any change was purely unintentional.    Darletta Moll, PA-C 12/21/20 0029    Lavonia Drafts, MD 12/25/20 [phone]

## 2020-12-20 NOTE — ED Triage Notes (Signed)
Pt comes into the ED via POV c/o dog bite to the left hand.  Dog was the owners and the dog is up to date on rabies vaccines.  Pt in NAD and all bleeding under control.  Puncture wounds present.

## 2021-10-26 ENCOUNTER — Encounter: Payer: Self-pay | Admitting: Emergency Medicine

## 2021-10-26 ENCOUNTER — Emergency Department: Payer: Medicaid Other

## 2021-10-26 ENCOUNTER — Other Ambulatory Visit: Payer: Self-pay

## 2021-10-26 ENCOUNTER — Emergency Department
Admission: EM | Admit: 2021-10-26 | Discharge: 2021-10-26 | Disposition: A | Discharge status: Home / Self Care | Payer: Medicaid Other | Attending: Emergency Medicine | Admitting: Emergency Medicine

## 2021-10-26 DIAGNOSIS — Z8541 Personal history of malignant neoplasm of cervix uteri: Secondary | ICD-10-CM | POA: Diagnosis not present

## 2021-10-26 DIAGNOSIS — Z87891 Personal history of nicotine dependence: Secondary | ICD-10-CM | POA: Diagnosis not present

## 2021-10-26 DIAGNOSIS — S0990XA Unspecified injury of head, initial encounter: Secondary | ICD-10-CM

## 2021-10-26 DIAGNOSIS — S0101XA Laceration without foreign body of scalp, initial encounter: Secondary | ICD-10-CM | POA: Diagnosis not present

## 2021-10-26 DIAGNOSIS — W010XXA Fall on same level from slipping, tripping and stumbling without subsequent striking against object, initial encounter: Secondary | ICD-10-CM | POA: Insufficient documentation
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
## 2021-10-26 NOTE — ED Provider Notes (Signed)
[facility] Provider Note    Event Date/Time   First MD Initiated Contact with Patient 10/26/21 [phone]     (approximate)   History   Head Injury   HPI  AILEN STRAUCH is a 51 y.o. female   presents to the ED with complaint of tripping over a laundry and hitting her head on the dryer at approximately 3 AM.  Patient had bleeding from her scalp which eventually was able to be controlled.  Patient denies any loss of consciousness, visual changes or nausea.  She states she went to work and her boss saw this and sent her to the nurse who stated that she needed to come to the emergency department to be evaluated for head injury.  Patient does not take blood thinners.  Patient does have a history of smoking and also cervical cancer and right ovarian cyst.      Physical Exam   Triage Vital Signs: ED Triage Vitals  Enc Vitals Group     BP 10/26/21 0858 (!) 154/99     Pulse Rate 10/26/21 0858 68     Resp 10/26/21 0858 16     Temp 10/26/21 0858 98 F (36.7 C)     Temp Source 10/26/21 0858 Oral     SpO2 10/26/21 0858 96 %     Weight 10/26/21 0853 96 lb (43.5 kg)     Height 10/26/21 0853 5\' 3"  (1.6 m)     Head Circumference --      Peak Flow --      Pain Score 10/26/21 0853 5     Pain Loc --      Pain Edu? --      Excl. in [place]? --     Most recent vital signs: Vitals:   10/26/21 0858  BP: (!) 154/99  Pulse: 68  Resp: 16  Temp: 98 F (36.7 C)  SpO2: 96%     General: Awake, no distress.  Alert, talkative, able to ambulate without any assistance. CV:  Good peripheral perfusion.  Heart regular rate and rhythm. Resp:  Normal effort.  Lungs are clear bilaterally. Abd:  No distention.  Other:  There is a 2 cm superficial laceration/abrasion to the left anterior scalp area without active bleeding at this time.  Cranial nerves II through XII grossly intact.  PERRLA, EOMI's, no facial bone tenderness on palpation.  No injuries noted to the cervical spine  however patient does complain with some mild tenderness especially with range of motion.  Reflexes are equal lower extremity to plus.  Good muscle strength.  Patient is ambulatory without any assistance.  Good muscle strength at 5/5.   ED Results / Procedures / Treatments   Labs (all labs ordered are listed, but only abnormal results are displayed) Labs Reviewed - No data to display     RADIOLOGY I reviewed the CT head and cervical spine images with no obvious injury noted.  Radiology report was reviewed and reports no intracranial injury or skull fracture.  There are some disc narrowing of the cervical spine with the most involvement C5-C6.    PROCEDURES:  Critical Care performed:   Procedures   MEDICATIONS ORDERED IN ED: Medications - No data to display   IMPRESSION / MDM / [place] / ED COURSE  I reviewed the triage vital signs and the nursing notes.   Differential diagnosis includes, but is not limited to, minor head injury without loss of consciousness, laceration scalp, cervical sprain,  cervical fracture, contusion scalp.  51 year old female presents to the ED with complaint of laceration to her scalp and injury to her head with a fall that occurred at approximately 3 AM.  Patient states that this was a mechanical fall as she tripped over some laundry and hit her head on the dryer.  She denies any LOC and currently no nausea, vomiting or visual changes.  Laceration was very superficial and bleeding was controlled.  No sutures were applied in this area and patient was given instructions on how to care for it.  I reviewed the CT head and cervical spine with patient letting her know that there was no intracranial injury but that there was this space narrowing of the cervical spine especially C5-C6 area.  Patient tetanus was up-to-date.  She is encouraged to use ice to her scalp as needed for discomfort.  Also she plans to take Tylenol at home as needed for pain.  A  note was given for her to be out of work today.  She is to watch the scalp for any signs of infection.  She was given return precautions.        FINAL CLINICAL IMPRESSION(S) / ED DIAGNOSES   Final diagnoses:  Minor head injury, initial encounter  Scalp laceration, initial encounter     Rx / DC Orders   ED Discharge Orders     None        Note:  This document was prepared using Dragon voice recognition software and may include unintentional dictation errors.   Johnn Hai, PA-C 10/26/21 1138    Harvest Dark, MD 10/26/21 1428

## 2021-10-26 NOTE — Discharge Instructions (Signed)
Follow-up with your primary care provider if any continued problems or concerns.  Return to the emergency department if any severe worsening of your headache, vision, nausea, vomiting especially if it is projectile vomiting.  You may take over-the-counter medication for pain.  Clean the area on your scalp and was allowed to dry completely before applying some Neosporin to it.  Watch this area for any signs of infection.  You may also use ice to your scalp if needed for pain control.

## 2021-10-26 NOTE — ED Notes (Signed)
See triage note  presents s/p trip and fall states she tripped on some clothes in the floor  hitting head on dryer  small laceration noted to side of head  no LOC

## 2021-11-09 ENCOUNTER — Other Ambulatory Visit: Payer: Self-pay

## 2021-11-09 ENCOUNTER — Encounter: Payer: Self-pay | Admitting: Emergency Medicine

## 2021-11-09 ENCOUNTER — Emergency Department: Payer: Medicaid Other

## 2021-11-09 ENCOUNTER — Inpatient Hospital Stay
Admission: EM | Admit: 2021-11-09 | Discharge: 2021-11-12 | DRG: 145 | Disposition: A | Discharge status: Home / Self Care | Payer: Medicaid Other | Attending: Hospitalist | Admitting: Hospitalist

## 2021-11-09 DIAGNOSIS — Z87892 Personal history of anaphylaxis: Secondary | ICD-10-CM

## 2021-11-09 DIAGNOSIS — Z7952 Long term (current) use of systemic steroids: Secondary | ICD-10-CM

## 2021-11-09 DIAGNOSIS — Z20822 Contact with and (suspected) exposure to covid-19: Secondary | ICD-10-CM | POA: Diagnosis present

## 2021-11-09 DIAGNOSIS — F17200 Nicotine dependence, unspecified, uncomplicated: Secondary | ICD-10-CM | POA: Diagnosis present

## 2021-11-09 DIAGNOSIS — Z8541 Personal history of malignant neoplasm of cervix uteri: Secondary | ICD-10-CM

## 2021-11-09 DIAGNOSIS — B974 Respiratory syncytial virus as the cause of diseases classified elsewhere: Secondary | ICD-10-CM | POA: Diagnosis present

## 2021-11-09 DIAGNOSIS — B338 Other specified viral diseases: Secondary | ICD-10-CM | POA: Diagnosis present

## 2021-11-09 DIAGNOSIS — Z880 Allergy status to penicillin: Secondary | ICD-10-CM | POA: Diagnosis not present

## 2021-11-09 DIAGNOSIS — J36 Peritonsillar abscess: Secondary | ICD-10-CM | POA: Diagnosis present

## 2021-11-09 LAB — BASIC METABOLIC PANEL
Anion gap: 6 (ref 5–15)
BUN: 7 mg/dL (ref 6–20)
CO2: 27 mmol/L (ref 22–32)
Calcium: 8.8 mg/dL — ABNORMAL LOW (ref 8.9–10.3)
Chloride: 107 mmol/L (ref 98–111)
Creatinine, Ser: 0.56 mg/dL (ref 0.44–1.00)
GFR, Estimated: >60 mL/min (ref >60)
Glucose, Bld: 114 mg/dL — ABNORMAL HIGH (ref 70–99)
Potassium: 3.2 mmol/L — ABNORMAL LOW (ref 3.5–5.1)
Sodium: 140 mmol/L (ref 135–145)

## 2021-11-09 LAB — CBC WITH DIFFERENTIAL/PLATELET
Abs Immature Granulocytes: 0.07 10*3/uL (ref 0.00–0.07)
Basophils Absolute: 0.1 10*3/uL (ref 0.0–0.1)
Basophils Relative: 0 %
Eosinophils Absolute: 0 10*3/uL (ref 0.0–0.5)
Eosinophils Relative: 0 %
HCT: 38.2 % (ref 36.0–46.0)
Hemoglobin: 12.7 g/dL (ref 12.0–15.0)
Immature Granulocytes: 0 %
Lymphocytes Relative: 7 %
Lymphs Abs: 1.1 10*3/uL (ref 0.7–4.0)
MCH: 32.8 pg (ref 26.0–34.0)
MCHC: 33.2 g/dL (ref 30.0–36.0)
MCV: 98.7 fL (ref 80.0–100.0)
Monocytes Absolute: 0.7 10*3/uL (ref 0.1–1.0)
Monocytes Relative: 4 %
Neutro Abs: 14.1 10*3/uL — ABNORMAL HIGH (ref 1.7–7.7)
Neutrophils Relative %: 89 %
Platelets: 168 10*3/uL (ref 150–400)
RBC: 3.87 MIL/uL (ref 3.87–5.11)
RDW: 13.2 % (ref 11.5–15.5)
WBC: 16.1 10*3/uL — ABNORMAL HIGH (ref 4.0–10.5)
nRBC: 0 % (ref 0.0–0.2)

## 2021-11-09 LAB — GROUP A STREP BY PCR: Group A Strep by PCR: NOT DETECTED

## 2021-11-09 LAB — RESP PANEL BY RT-PCR (FLU A&B, COVID) ARPGX2
Influenza A by PCR: NEGATIVE
Influenza B by PCR: NEGATIVE
SARS Coronavirus 2 by RT PCR: NEGATIVE

## 2021-11-09 MED ORDER — MORPHINE SULFATE (PF) 4 MG/ML IV SOLN
4.0000 mg | Freq: Once | INTRAVENOUS | Status: AC
  Administered 2021-11-09: 4 mg via INTRAVENOUS
  Filled 2021-11-09: qty 1

## 2021-11-09 MED ORDER — DEXAMETHASONE SODIUM PHOSPHATE 10 MG/ML IJ SOLN
10.0000 mg | Freq: Three times a day (TID) | INTRAMUSCULAR | Status: DC
  Administered 2021-11-09 – 2021-11-11 (×6): 10 mg via INTRAVENOUS
  Filled 2021-11-09 (×6): qty 1

## 2021-11-09 MED ORDER — SODIUM CHLORIDE 0.9 % IV SOLN: INTRAVENOUS | Status: DC

## 2021-11-09 MED ORDER — DEXAMETHASONE SODIUM PHOSPHATE 10 MG/ML IJ SOLN: 10.0000 mg | Freq: Four times a day (QID) | INTRAMUSCULAR | Status: DC

## 2021-11-09 MED ORDER — ACETAMINOPHEN 325 MG PO TABS
650.0000 mg | ORAL_TABLET | Freq: Three times a day (TID) | ORAL | Status: DC | PRN
  Administered 2021-11-10: 650 mg via ORAL
  Filled 2021-11-09: qty 2

## 2021-11-09 MED ORDER — ONDANSETRON HCL 4 MG/2ML IJ SOLN
4.0000 mg | Freq: Once | INTRAMUSCULAR | Status: AC
  Administered 2021-11-09: 4 mg via INTRAVENOUS
  Filled 2021-11-09: qty 2

## 2021-11-09 MED ORDER — ONDANSETRON HCL 4 MG/2ML IJ SOLN: 4.0000 mg | Freq: Four times a day (QID) | INTRAMUSCULAR | Status: DC | PRN

## 2021-11-09 MED ORDER — CLINDAMYCIN PHOSPHATE 600 MG/50ML IV SOLN
600.0000 mg | Freq: Three times a day (TID) | INTRAVENOUS | Status: DC
  Administered 2021-11-09 – 2021-11-12 (×9): 600 mg via INTRAVENOUS
  Filled 2021-11-09 (×12): qty 50

## 2021-11-09 MED ORDER — ACETAMINOPHEN ER 650 MG PO TBCR: 650.0000 mg | EXTENDED_RELEASE_TABLET | Freq: Three times a day (TID) | ORAL | Status: DC | PRN

## 2021-11-09 MED ORDER — CLINDAMYCIN PHOSPHATE 600 MG/50ML IV SOLN
600.0000 mg | Freq: Once | INTRAVENOUS | Status: AC
  Administered 2021-11-09: 600 mg via INTRAVENOUS
  Filled 2021-11-09: qty 50

## 2021-11-09 MED ORDER — IOHEXOL 300 MG/ML  SOLN
75.0000 mL | Freq: Once | INTRAMUSCULAR | Status: AC | PRN
  Administered 2021-11-09: 75 mL via INTRAVENOUS
  Filled 2021-11-09: qty 75

## 2021-11-09 MED ORDER — MORPHINE SULFATE (PF) 2 MG/ML IV SOLN
2.0000 mg | INTRAVENOUS | Status: DC | PRN
  Administered 2021-11-09 – 2021-11-10 (×4): 2 mg via INTRAVENOUS
  Filled 2021-11-09 (×4): qty 1

## 2021-11-09 MED ORDER — DEXAMETHASONE SODIUM PHOSPHATE 10 MG/ML IJ SOLN
10.0000 mg | Freq: Once | INTRAMUSCULAR | Status: AC
  Administered 2021-11-09: 10 mg via INTRAVENOUS
  Filled 2021-11-09: qty 1

## 2021-11-09 MED ORDER — SODIUM CHLORIDE 0.9 % IV BOLUS
1000.0000 mL | Freq: Once | INTRAVENOUS | Status: AC
  Administered 2021-11-09: 1000 mL via INTRAVENOUS

## 2021-11-09 MED ORDER — LIDOCAINE-EPINEPHRINE (PF) 2 %-1:200000 IJ SOLN
INTRAMUSCULAR | Status: AC
  Filled 2021-11-09: qty 20

## 2021-11-09 MED ORDER — ONDANSETRON HCL 4 MG PO TABS
4.0000 mg | ORAL_TABLET | Freq: Four times a day (QID) | ORAL | Status: DC | PRN
Start: 2021-11-09 — End: 2021-11-12

## 2021-11-09 NOTE — ED Provider Notes (Signed)
? ?[facility] ?Provider Note ? ? ? Event Date/Time  ? First MD Initiated Contact with Patient 11/09/21 1023   ?  (approximate) ? ? ?History  ? ?Sore Throat ? ? ?HPI ? ?Norma Pierce is a 51 y.o. female presents to the emergency department with severe right-sided throat pain.  Patient states she was seen at urgent care and given antibiotic and prednisone for tonsillitis.  States the pain and swelling have gotten worse.  Now radiating into the right ear.  She denies any known fever.  No chest pain or shortness of breath.  Was diagnosed with RSV yesterday along with tonsillitis. ? ?  ? ? ?Physical Exam  ? ?Triage Vital Signs: ?ED Triage Vitals  ?Enc Vitals Group  ?   BP 11/09/21 0952 (!) 158/87  ?   Pulse Rate 11/09/21 0952 78  ?   Resp 11/09/21 0952 20  ?   Temp 11/09/21 0952 99 ?F (37.2 ?C)  ?   Temp Source 11/09/21 0952 Oral  ?   SpO2 11/09/21 0952 98 %  ?   Weight 11/09/21 0953 93 lb (42.2 kg)  ?   Height 11/09/21 0953 5\' 3"  (1.6 m)  ?   Head Circumference --   ?   Peak Flow --   ?   Pain Score 11/09/21 0953 10  ?   Pain Loc --   ?   Pain Edu? --   ?   Excl. in [place]? --   ? ? ?Most recent vital signs: ?Vitals:  ? 11/09/21 0952 11/09/21 1256  ?BP: (!) 158/87 (!) 150/80  ?Pulse: 78 80  ?Resp: 20 18  ?Temp: 99 ?F (37.2 ?C)   ?SpO2: 98% 98%  ? ? ? ?General: Awake, no distress.   ?CV:  Good peripheral perfusion. regular rate and  rhythm ?Resp:  Normal effort. Lungs CTA ?Abd:  No distention.   ?Other:  Throat with severe swelling to the right side, no swelling on the left, uvula is not deviated at this time, right TM is normal ? ? ?ED Results / Procedures / Treatments  ? ?Labs ?(all labs ordered are listed, but only abnormal results are displayed) ?Labs Reviewed  ?BASIC METABOLIC PANEL - Abnormal; Notable for the following components:  ?    Result Value  ? Potassium 3.2 (*)   ? Glucose, Bld 114 (*)   ? Calcium 8.8 (*)   ? All other components within normal limits  ?CBC WITH DIFFERENTIAL/PLATELET  - Abnormal; Notable for the following components:  ? WBC 16.1 (*)   ? Neutro Abs 14.1 (*)   ? All other components within normal limits  ?GROUP A STREP BY PCR  ?RESP PANEL BY RT-PCR (FLU A&B, COVID) [id number]  ? ? ? ?EKG ? ? ? ? ?RADIOLOGY ?CT soft tissue of the neck ? ? ? ?PROCEDURES: ? ? ?Procedures ? ? ?MEDICATIONS ORDERED IN ED: ?Medications  ?morphine (PF) 4 MG/ML injection 4 mg (has no administration in time range)  ?sodium chloride 0.9 % bolus 1,000 mL (0 mLs Intravenous Stopped 11/09/21 1253)  ?ondansetron [facility]) injection 4 mg (4 mg Intravenous Given 11/09/21 1119)  ?dexamethasone (DECADRON) injection 10 mg (10 mg Intravenous Given 11/09/21 1119)  ?clindamycin (CLEOCIN) IVPB 600 mg (0 mg Intravenous Stopped 11/09/21 1253)  ?morphine (PF) 4 MG/ML injection 4 mg (4 mg Intravenous Given 11/09/21 1119)  ?iohexol (OMNIPAQUE) 300 MG/ML solution 75 mL (75 mLs Intravenous Contrast Given 11/09/21 1251)  ? ? ? ?IMPRESSION /  MDM / ASSESSMENT AND PLAN / ED COURSE  ?I reviewed the triage vital signs and the nursing notes. ?             ?               ? ?Differential diagnosis includes, but is not limited to, strep throat, tonsillitis, mono, peritonsillar abscess ? ?On exam there is severe swelling on the right side which is more indicative for peritonsillar abscess.  We will do labs and CT ? ?Clindamycin and Decadron started via IV.  Patient does have penicillin allergy so did not want to do Unasyn. ? ?CBC shows elevated WBC of 16.1 indicating infection, neutrophils are increased at 15.7, basic metabolic panel has decreased potassium of 3.2.  Strep test is negative ? ?Patient was given pain medication with the clindamycin and Decadron. ? ?CT soft tissue of the neck was independently reviewed by me.  I do see a tubelike abscess.  Radiologist is read as having 2 abscesses one is 1.1 cm in the tubal abscess is also 1.1 cm.  He is unsure if these are actually connecting to make 1 abscess. ? ?Consult to ENT.  Dr. Richardson Landry states he could  come over to the hospital at 5 PM to evaluate the patient.  I feel that since she is worsening and has the 2 abscesses I will go ahead and call the hospitalist for admission. ? ?Spoke with Dr. Francine Graven, children had already consulted with Dr. Richardson Landry.  Patient will be admitted for observation.  She is in stable condition at this time ? ? ?  ? ? ?FINAL CLINICAL IMPRESSION(S) / ED DIAGNOSES  ? ?Final diagnoses:  ?Peritonsillar abscess  ? ? ? ?Rx / DC Orders  ? ?ED Discharge Orders   ? ? None  ? ?  ? ? ? ?Note:  This document was prepared using Dragon voice recognition software and may include unintentional dictation errors. ? ?  ?Versie Starks, PA-C ?11/09/21 1356 ? ?  ?Lucrezia Starch, MD ?11/09/21 1411 ? ?

## 2021-11-09 NOTE — ED Notes (Signed)
Pt brought ice water & italian ice per request to help with her throat pain. No additional needs verbalized at this time. Bed low & locked; call light & personal items within reach. ?

## 2021-11-09 NOTE — ED Notes (Signed)
MD at bedside. 

## 2021-11-09 NOTE — ED Notes (Signed)
Pt ambulated independently to restroom & back to pt room. ?

## 2021-11-09 NOTE — Assessment & Plan Note (Addendum)
--  not coughing  ?--Supportive care ?

## 2021-11-09 NOTE — Consult Note (Addendum)
[place], [place] [id number] 05-14-1971 Riley Nearing, MD  Reason for Consult: Peritonsillar abscess Requesting Physician: Collier Bullock, MD Consulting Physician: Riley Nearing, MD  HPI: This 51 y.o. year old female was admitted on 11/09/2021 for Peritonsillar abscess [[id number]].  Patient has a history of right ear pain and sore throat over the past 4 to 5 days worsening to the point she had difficulty swallowing.  She was put on Zithromax yesterday but came to the emergency room when she did not show any improvement.  CT imaging revealed tonsillitis with 2 small areas of hypodensity suggestive of early abscess.  She also was diagnosed with RSV.  She has been started on IV antibiotics and steroids and does note some interval improvement.  She reports no prior history of recurrent tonsillitis.  Allergies:  Allergies  Allergen Reactions   Penicillins Anaphylaxis and Hives    Has patient had a PCN reaction causing immediate rash, facial/tongue/throat swelling, SOB or lightheadedness with hypotension: Yes Has patient had a PCN reaction causing severe rash involving mucus membranes or skin necrosis: No Has patient had a PCN reaction that required hospitalization: Yes Has patient had a PCN reaction occurring within the last 10 years: No If all of the above answers are "NO", then may proceed with Cephalosporin use.     Medications: (Not in a hospital admission) .  Current Facility-Administered Medications  Medication Dose Route Frequency Provider Last Rate Last Admin   0.9 %  sodium chloride infusion   Intravenous Continuous Agbata, Tochukwu, MD 100 mL/hr at 11/09/21 1557 New Bag at 11/09/21 1557   acetaminophen (TYLENOL) tablet 650 mg  650 mg Oral Q8H PRN Agbata, Tochukwu, MD       clindamycin (CLEOCIN) IVPB 600 mg  600 mg Intravenous Q8H Agbata, Tochukwu, MD   Stopped at 11/09/21 1654   dexamethasone (DECADRON) injection 10 mg  10 mg Intravenous Q8H Agbata, Tochukwu, MD   10 mg at 11/09/21 1721    lidocaine-EPINEPHrine (XYLOCAINE W/EPI) 2 %-1:200000 (PF) injection            morphine (PF) 2 MG/ML injection 2 mg  2 mg Intravenous Q3H PRN Agbata, Tochukwu, MD       ondansetron (ZOFRAN) tablet 4 mg  4 mg Oral Q6H PRN Agbata, Tochukwu, MD       Or   ondansetron (ZOFRAN) injection 4 mg  4 mg Intravenous Q6H PRN Agbata, Tochukwu, MD       Current Outpatient Medications  Medication Sig Dispense Refill   acetaminophen (TYLENOL) 650 MG CR tablet Take 650 mg by mouth every 8 (eight) hours as needed for pain.     azithromycin (ZITHROMAX) 250 MG tablet Take 250-500 mg by mouth daily.     DPH-Lido-AlHydr-MgHydr-Simeth (FIRST-MOUTHWASH BLM) SUSP Take 5 mLs by mouth every 6 (six) hours as needed for pain.     predniSONE (DELTASONE) 20 MG tablet Take 20 mg by mouth daily.      PMH:  Past Medical History:  Diagnosis Date   Cancer ([facility])    cervical   Ovarian cyst     Fam Hx:  Family History  Problem Relation Age of Onset   Lung cancer Mother    Diabetes Mellitus II Sister    Skin cancer Sister    Breast cancer Maternal Aunt    Hypertension Maternal Aunt    COPD Maternal Aunt    Skin cancer Maternal Aunt    Fibromyalgia Maternal Aunt    Brain cancer Maternal Uncle  Aneurysm Maternal Grandmother    Heart attack Maternal Grandfather     Soc Hx:  Social History   Socioeconomic History   Marital status: Married    Spouse name: Not on file   Number of children: Not on file   Years of education: Not on file   Highest education level: Not on file  Occupational History   Not on file  Tobacco Use   Smoking status: Every Day   Smokeless tobacco: Current  Vaping Use   Vaping Use: Never used  Substance and Sexual Activity   Alcohol use: No   Drug use: No   Sexual activity: Not Currently  Other Topics Concern   Not on file  Social History Narrative   Not on file   Social Determinants of Health   Financial Resource Strain: Not on file  Food Insecurity: Not on file   Transportation Needs: Not on file  Physical Activity: Not on file  Stress: Not on file  Social Connections: Not on file  Intimate Partner Violence: Not on file    PSH: History reviewed. No pertinent surgical history.. Procedures since admission: No admission procedures for hospital encounter.  ROS: Review of systems normal other than 12 systems except per HPI.  PHYSICAL EXAM Vitals:  Vitals:   11/09/21 0952 11/09/21 1256  BP: (!) 158/87 (!) 150/80  Pulse: 78 80  Resp: 20 18  Temp: 99 F (37.2 C)   SpO2: 98% 98%  .  General: Well-developed, Well-nourished in no acute distress Mood: Mood and affect well adjusted, pleasant and cooperative. Orientation: Grossly alert and oriented. Vocal Quality: No hoarseness. Communicates verbally. head and Face: NCAT. No facial asymmetry. No visible skin lesions. No significant facial scars. No tenderness with sinus percussion. Facial strength normal and symmetric. Ears: External ears with normal landmarks, no lesions. External auditory canals free of infection, cerumen impaction or lesions. Tympanic membranes intact with good landmarks and normal mobility on pneumatic otoscopy. No middle ear effusion. Hearing: Speech reception grossly normal. Nose: External nose normal with midline dorsum and no lesions or deformity. Nasal Cavity reveals essentially midline septum with normal inferior turbinates. No significant mucosal congestion or erythema. Nasal secretions are minimal and clear. No polyps seen on anterior rhinoscopy. Oral Cavity/ Oropharynx: Lips are normal with no lesions.  Patient wears dentures. Gingiva healthy with no lesions or gingivitis.  Right tonsil is more swollen than the left with minimal swelling of the adjacent soft palate and no uvular shift.  There is a dense exudate on the medial aspect of the tonsil.  There is no airway compromise.[place] Laryngoscopy/Nasopharyngoscopy: Visualization of the larynx, hypopharynx and nasopharynx  is not possible in this setting with routine examination. Neck: Supple and symmetric with no palpable masses, tenderness or crepitance. The trachea is midline. Thyroid gland is soft, nontender and symmetric with no masses or enlargement. Parotid and submandibular glands are soft, nontender and symmetric, without masses. Lymphatic: Shotty lymphadenopathy in the jugulodigastric region. Respiratory: Normal respiratory effort without labored breathing. Cardiovascular: Carotid pulse shows regular rate and rhythm Neurologic: Cranial Nerves II through XII are grossly intact. Eyes: Gaze and Ocular Motility are grossly normal. PERRLA. No visible nystagmus.  MEDICAL DECISION MAKING: Data Review:  Results for orders placed or performed during the hospital encounter of 11/09/21 (from the past 48 hour(s))  Group A Strep by PCR ([facility] Only)     Status: None   Collection Time: 11/09/21  9:58 AM   Specimen: Throat; Sterile Swab  Result Value Ref  Range   Group A Strep by PCR NOT DETECTED NOT DETECTED    Comment: Performed at [facility], [address] [place], [place] [address]  Basic metabolic panel     Status: Abnormal   Collection Time: 11/09/21 11:58 AM  Result Value Ref Range   Sodium 140 135 - 145 mmol/L   Potassium 3.2 (L) 3.5 - 5.1 mmol/L   Chloride 107 98 - 111 mmol/L   CO2 27 22 - 32 mmol/L   Glucose, Bld 114 (H) 70 - 99 mg/dL    Comment: Glucose reference range applies only to samples taken after fasting for at least 8 hours.   BUN 7 6 - 20 mg/dL   Creatinine, Ser 0.56 0.44 - 1.00 mg/dL   Calcium 8.8 (L) 8.9 - 10.3 mg/dL   GFR, Estimated >60 >60 mL/min    Comment: (NOTE) Calculated using the CKD-EPI Creatinine Equation (2021)    Anion gap 6 5 - 15    Comment: Performed at [facility], [address] [place], [place] [address]  CBC with Differential     Status: Abnormal   Collection Time: 11/09/21 11:58 AM  Result Value Ref Range   WBC 16.1 (H) 4.0 - 10.5 K/uL    RBC 3.87 3.87 - 5.11 MIL/uL   Hemoglobin 12.7 12.0 - 15.0 g/dL   HCT 38.2 36.0 - 46.0 %   MCV 98.7 80.0 - 100.0 fL   MCH 32.8 26.0 - 34.0 pg   MCHC 33.2 30.0 - 36.0 g/dL   RDW 13.2 11.5 - 15.5 %   Platelets 168 150 - 400 K/uL   nRBC 0.0 0.0 - 0.2 %   Neutrophils Relative % 89 %   Neutro Abs 14.1 (H) 1.7 - 7.7 K/uL   Lymphocytes Relative 7 %   Lymphs Abs 1.1 0.7 - 4.0 K/uL   Monocytes Relative 4 %   Monocytes Absolute 0.7 0.1 - 1.0 K/uL   Eosinophils Relative 0 %   Eosinophils Absolute 0.0 0.0 - 0.5 K/uL   Basophils Relative 0 %   Basophils Absolute 0.1 0.0 - 0.1 K/uL   Immature Granulocytes 0 %   Abs Immature Granulocytes 0.07 0.00 - 0.07 K/uL    Comment: Performed at [facility], [address], [place], [place] [address]  Resp Panel by RT-PCR (Flu A&B, Covid) Nasopharyngeal Swab     Status: None   Collection Time: 11/09/21  1:56 PM   Specimen: Nasopharyngeal Swab; Nasopharyngeal(NP) swabs in vial transport medium  Result Value Ref Range   SARS Coronavirus 2 by RT PCR NEGATIVE NEGATIVE    Comment: (NOTE) SARS-CoV-2 target nucleic acids are NOT DETECTED.  The SARS-CoV-2 RNA is generally detectable in upper respiratory specimens during the acute phase of infection. The lowest concentration of SARS-CoV-2 viral copies this assay can detect is 138 copies/mL. A negative result does not preclude SARS-Cov-2 infection and should not be used as the sole basis for treatment or other patient management decisions. A negative result may occur with  improper specimen collection/handling, submission of specimen other than nasopharyngeal swab, presence of viral mutation(s) within the areas targeted by this assay, and inadequate number of viral copies(<138 copies/mL). A negative result must be combined with clinical observations, patient history, and epidemiological information. The expected result is Negative.  Fact Sheet for Patients:   EntrepreneurPulse.com.au  Fact Sheet for Healthcare Providers:  IncredibleEmployment.be  This test is no t yet approved or cleared by the [place] FDA and  has  been authorized for detection and/or diagnosis of SARS-CoV-2 by FDA under an Emergency Use Authorization (EUA). This EUA will remain  in effect (meaning this test can be used) for the duration of the COVID-19 declaration under Section 564(b)(1) of the Act, 21 U.S.C.section 360bbb-3(b)(1), unless the authorization is terminated  or revoked sooner.       Influenza A by PCR NEGATIVE NEGATIVE   Influenza B by PCR NEGATIVE NEGATIVE    Comment: (NOTE) The Xpert Xpress SARS-CoV-2/FLU/RSV plus assay is intended as an aid in the diagnosis of influenza from Nasopharyngeal swab specimens and should not be used as a sole basis for treatment. Nasal washings and aspirates are unacceptable for Xpert Xpress SARS-CoV-2/FLU/RSV testing.  Fact Sheet for Patients: EntrepreneurPulse.com.au  Fact Sheet for Healthcare Providers: IncredibleEmployment.be  This test is not yet approved or cleared by the [place] FDA and has been authorized for detection and/or diagnosis of SARS-CoV-2 by FDA under an Emergency Use Authorization (EUA). This EUA will remain in effect (meaning this test can be used) for the duration of the COVID-19 declaration under Section 564(b)(1) of the Act, 21 U.S.C. section 360bbb-3(b)(1), unless the authorization is terminated or revoked.  Performed at [facility], [address] [place], [place] [address]   . CT Soft Tissue Neck W Contrast  Result Date: 11/09/2021 CLINICAL DATA:  Sore throat and mouth pain EXAM: CT NECK WITH CONTRAST TECHNIQUE: Multidetector CT imaging of the neck was performed using the standard protocol following the bolus administration of intravenous contrast. RADIATION DOSE REDUCTION: This exam was performed  according to the departmental dose-optimization program which includes automated exposure control, adjustment of the mA and/or kV according to patient size and/or use of iterative reconstruction technique. CONTRAST:  41mL OMNIPAQUE IOHEXOL 300 MG/ML  SOLN COMPARISON:  Cervical spine CT 10/26/2021 FINDINGS: Pharynx and larynx: The nasal cavity and nasopharynx are unremarkable. There is swelling of the right palatine tonsils. There is a peripherally enhancing collection within the right tonsils measuring 1.1 cm by 0.7 cm by 0.6 cm (2-26, 6-38). There is an additional tubular peripherally enhancing collection in the submucosal space of the right oropharynx measuring 1.3 cm AP by 1.4 cm TV by 2.7 cm cc. This collection may be contiguous with the smaller collection superiorly. There is associated mucosal hyperenhancement as well as fluid tracking in the tissues of the right oropharynx inferior along the right crus of the hyoid bone and thyroid cartilage. There is a thin retropharyngeal effusion without evidence of organized retropharyngeal abscess. There is mild stranding in the right parapharyngeal space. There is no high-grade effacement of the airway, but there is slight asymmetric effacement of the right piriform sinus. The larynx is normal.  The vocal folds are normal in appearance. Salivary glands: The parotid and submandibular glands are unremarkable. Thyroid: Unremarkable. Lymph nodes: There are prominent right cervical chain lymph nodes measuring up to 1.2 cm at level IIA, increased in size from 0.8 cm on the prior study from 10/26/2021. There is a 0.8 cm left level IIA lymph node which is not significantly changed. These are likely reactive. Likely reactive. Vascular: The major vessels of the neck are unremarkable. Limited intracranial: The imaged portions of the intracranial compartment are unremarkable. Visualized orbits: The imaged globes and orbits are unremarkable. Mastoids and visualized paranasal  sinuses: The imaged paranasal sinuses and mastoid air cells are clear. Skeleton: There is no acute osseous abnormality or aggressive osseous lesion. Upper chest: The imaged lung apices are clear. Other: None. IMPRESSION: 1.  Evidence of tonsillitis with a 1.1 cm abscess at the level of the right palatine tonsil and an additional tubular abscess extending more inferiorly in the submucosal tissues of the right oropharynx. There is no definite connection between these two collections. 2. Associated mucosal thickening and enhancement with soft tissue swelling as well as a retropharyngeal effusion. No evidence of organized retropharyngeal abscess. 3. Prominent cervical chain lymph nodes are likely reactive. Electronically Signed   By: Valetta Mole M.D.   On: 11/09/2021 13:15  .   PROCEDURE: Preoperative Diagnosis: Right peritonsillar abscess Postoperative Diagnosis: Same Procedure: Attempted aspiration of right peritonsillar abscess Indications: Right peritonsillar abscess Findings: A small amount of cloudy material was aspirated although no frank purulence was isolated after several attempts at needle aspiration. Description of Procedure: After discussing procedure and risks  (primarily bleeding) with the patient, the  region around the right tonsil was injected with 2% lidocaine with epinephrine, 1:200,000. An 18 gage needle was used to aspirate the tonsil.  Several attempts at aspiration were performed including the superolateral tonsil, and mid to lower lateral tonsil regions.  Scant amounts of cloudy mucoid material was aspirated but no gross purulence. The patient tolerated the procedure well.   ASSESSMENT: Right peritonsillar abscess/cellulitis  PLAN: I was able to aspirate a small amount of material but no substantial purulence after several passes.  She is already showing some progress on IV antibiotics and steroids.  Some of this may represent early phlegmon formation given the lack of gross  purulence.  I would continue IV antibiotics and steroids and if she continues to improve she could be potentially discharged on oral clindamycin 300 mg p.o. 4 times daily for 10 days and a Sterapred DS 6-day taper.  She was asking about return to work and obviously a lot of this depends on her speed of recovery but she will also need recommendations from the primary care team regarding any isolation precautions for RSV.  Riley Nearing, MD 11/09/2021 5:56 PM

## 2021-11-09 NOTE — ED Triage Notes (Addendum)
Pt in with co sore throat and mouth pain. STates was dx yesterday with RSV and tonsillitis. Pt states she feels shob dues to pain and swelling. No resp distress noted. Pt is on prednisone, zithromax but not able to fill mouth was due to cost. PT dx with covid 3 weeks ago.  ?

## 2021-11-09 NOTE — H&P (Signed)
?History and Physical  ? ? ?Patient: Norma Pierce:[id number] DOB: 01-05-1971 ?DOA: 11/09/2021 ?DOS: the patient was seen and examined on 11/09/2021 ?PCP: Pcp, No  ?Patient coming from: Home ? ?Chief Complaint:  ?Chief Complaint  ?Patient presents with  ? Sore Throat  ? ? ?HPI: Norma Pierce is a 51 y.o. female with medical history significant for cervical cancer who presents to the ER for evaluation of several days of pain with swallowing which got worse 24 hours prior to her presentation and is associated with drooling and inability to swallow. ?Patient states her symptoms started about 5 days ago with a scratchy throat and 24 hours later she developed a sore throat which has progressively worsened.  Pain is described as a sharp pain and rated an 8 x 10 in intensity at its worst with radiation to her right ear. ?She denies having any fever or chills but due to the severity of her symptoms she was seen at the urgent care and prescribed erythromycin and prednisone she has been unable to take due to painful swallowing.  She had a viral panel done at the urgent care and tested positive for RSV. ?Over the last 24 hours she now has drooling especially from the right side and swelling involving the right side of her neck. ?She had a CT soft tissue neck which showed evidence of tonsillitis with a 1.1 cm abscess at the level of the right palatine tonsil and an additional tubular abscess extending more inferiorly in the submucosal tissues of the right oropharynx. There is no definite connection between these two collections. ?Associated mucosal thickening and enhancement with soft tissue ?swelling as well as a retropharyngeal effusion. No evidence of ?organized retropharyngeal abscess. Prominent cervical chain lymph nodes are likely reactive. ?She denies having any chest pain, no shortness of breath, no nausea, no vomiting, no headache, no cough, no dizziness, no lightheadedness, no abdominal pain, no changes in her  bowel habits, no urinary symptoms, no blurred vision or focal deficit. ?Review of Systems: As mentioned in the history of present illness. All other systems reviewed and are negative. ?Past Medical History:  ?Diagnosis Date  ? Cancer [facility])   ? cervical  ? Ovarian cyst   ? ?History reviewed. No pertinent surgical history. ?Social History:  reports that she has been smoking. She uses smokeless tobacco. She reports that she does not drink alcohol and does not use drugs. ? ?Allergies  ?Allergen Reactions  ? Penicillins Anaphylaxis and Hives  ?  Has patient had a PCN reaction causing immediate rash, facial/tongue/throat swelling, SOB or lightheadedness with hypotension: Yes ?Has patient had a PCN reaction causing severe rash involving mucus membranes or skin necrosis: No ?Has patient had a PCN reaction that required hospitalization: Yes ?Has patient had a PCN reaction occurring within the last 10 years: No ?If all of the above answers are "NO", then may proceed with Cephalosporin use. ?  ? ? ?Family History  ?Problem Relation Age of Onset  ? Lung cancer Mother   ? Diabetes Mellitus II Sister   ? Skin cancer Sister   ? Breast cancer Maternal Aunt   ? Hypertension Maternal Aunt   ? COPD Maternal Aunt   ? Skin cancer Maternal Aunt   ? Fibromyalgia Maternal Aunt   ? Brain cancer Maternal Uncle   ? Aneurysm Maternal Grandmother   ? Heart attack Maternal Grandfather   ? ? ?Prior to Admission medications   ?Medication Sig Start Date End Date Taking? Authorizing Provider  ?  acetaminophen (TYLENOL) 650 MG CR tablet Take 650 mg by mouth every 8 (eight) hours as needed for pain.   Yes [provider]  ?azithromycin (ZITHROMAX) 250 MG tablet Take 250-500 mg by mouth daily. 11/08/21  Yes [provider]  ?DPH-Lido-AlHydr-MgHydr-Simeth (FIRST-MOUTHWASH BLM) SUSP Take 5 mLs by mouth every 6 (six) hours as needed for pain. 11/08/21  Yes [provider]  ?predniSONE (DELTASONE) 20 MG tablet Take 20 mg by mouth  daily. 11/08/21  Yes [provider]  ? ? ?Physical Exam: ?Vitals:  ? 11/09/21 0952 11/09/21 0953 11/09/21 1256  ?BP: (!) 158/87  (!) 150/80  ?Pulse: 78  80  ?Resp: 20  18  ?Temp: 99 ?F (37.2 ?C)    ?TempSrc: Oral    ?SpO2: 98%  98%  ?Weight:  42.2 kg   ?Height:  5\' 3"  (1.6 m)   ? ?Physical Exam ?Vitals and nursing note reviewed.  ?Constitutional:   ?   Appearance: She is normal weight.  ?   Comments: Acutely ill-appearing  ?HENT:  ?   Head: Normocephalic and atraumatic.  ?   Mouth/Throat:  ?   Mouth: Mucous membranes are dry.  ?Eyes:  ?   Conjunctiva/sclera: Conjunctivae normal.  ?Cardiovascular:  ?   Rate and Rhythm: Normal rate and regular rhythm.  ?   Heart sounds: Normal heart sounds.  ?Pulmonary:  ?   Effort: Pulmonary effort is normal.  ?   Breath sounds: Normal breath sounds.  ?Abdominal:  ?   General: Bowel sounds are normal.  ?   Palpations: Abdomen is soft.  ?Musculoskeletal:  ?   Cervical back: Normal range of motion and neck supple.  ?Lymphadenopathy:  ?   Cervical: Cervical adenopathy present.  ?Skin: ?   General: Skin is warm and dry.  ?Neurological:  ?   General: No focal deficit present.  ?   Mental Status: She is alert and oriented to person, place, and time.  ?Psychiatric:     ?   Mood and Affect: Mood normal.     ?   Behavior: Behavior normal.  ? ? ? ?Data Reviewed: ?Relevant notes from primary care and specialist visits, past discharge summaries as available in EHR, including Care Everywhere. ?Prior diagnostic testing as pertinent to current admission diagnoses ?Updated medications and problem lists for reconciliation ?ED course, including vitals, labs, imaging, treatment and response to treatment ?Triage notes, nursing and pharmacy notes and ED provider's notes ?Notable results as noted in HPI ?Labs reviewed.  White count of 16,000, potassium 3.2 ?CT soft tissue neck showed evidence of tonsillitis with a 1.1 cm abscess at the level of the right palatine tonsil and an additional tubular  abscess extending more inferiorly in the submucosal tissues of the right oropharynx. ?There is no definite connection between these two collections. ?Associated mucosal thickening and enhancement with soft tissue ?swelling as well as a retropharyngeal effusion. No evidence of ?organized retropharyngeal abscess. Prominent cervical chain lymph nodes are likely reactive. ?There are no new results to review at this time. ? ?Assessment and Plan: ?* Peritonsillar abscess ?Patient presents for evaluation of painful swallowing with radiation to the right ear and imaging shows peritonsillar abscess. ?Continue clindamycin initiated in the ER as well as Decadron 10 mg IV every 6 ?ENT consult ?Keep patient n.p.o. for now ? ?RSV infection ?Will place patient on contact and droplet precautions for now ?Supportive care ? ? ? ? ? ? ?Advance Care Planning:   Code Status: Full Code  ? ?  Consults:ENT ? ?Family Communication: Greater than 50% of time was spent patient's condition and plan of care with her at the bedside.  All questions and concerns have been addressed.  She verbalizes understanding and agrees with the plan. ? ?Severity of Illness: ?The appropriate patient status for this patient is INPATIENT. Inpatient status is judged to be reasonable and necessary in order to provide the required intensity of service to ensure the patient's safety. The patient's presenting symptoms, physical exam findings, and initial radiographic and laboratory data in the context of their chronic comorbidities is felt to place them at high risk for further clinical deterioration. Furthermore, it is not anticipated that the patient will be medically stable for discharge from the hospital within 2 midnights of admission.  ? ?* I certify that at the point of admission it is my clinical judgment that the patient will require inpatient hospital care spanning beyond 2 midnights from the point of admission due to high intensity of service, high risk for  further deterioration and high frequency of surveillance required.* ? ?Author: ?Collier Bullock, MD ?11/09/2021 3:24 PM ? ?For on call review [email or web address].  ?

## 2021-11-09 NOTE — Assessment & Plan Note (Addendum)
Patient presents for evaluation of painful swallowing with radiation to the right ear and imaging shows peritonsillar abscess. ?--clindamycin initiated in the ER as well as Decadron 10 mg IV every 6 ?--ENT aspirated a small amount of material but no substantial purulence after several passes, may represent early phlegmon formation given the lack of gross purulence.  ?Plan: ?--cont IV clinda ?--taper down IV decadron to 5 mg q6h ?--Norco PRN for pain ?--dys 2 diet for softer consistency ?

## 2021-11-09 NOTE — ED Notes (Signed)
Pt resting comfortably in bed, NAD. No needs identified at this time. Bed low & locked; call light & personal items within reach. Lights dimmed per pt request for comfort. ?

## 2021-11-10 DIAGNOSIS — J36 Peritonsillar abscess: Secondary | ICD-10-CM | POA: Diagnosis not present

## 2021-11-10 LAB — CBC
HCT: 38.4 % (ref 36.0–46.0)
Hemoglobin: 12.8 g/dL (ref 12.0–15.0)
MCH: 32.5 pg (ref 26.0–34.0)
MCHC: 33.3 g/dL (ref 30.0–36.0)
MCV: 97.5 fL (ref 80.0–100.0)
Platelets: 186 10*3/uL (ref 150–400)
RBC: 3.94 MIL/uL (ref 3.87–5.11)
RDW: 13.1 % (ref 11.5–15.5)
WBC: 17.5 10*3/uL — ABNORMAL HIGH (ref 4.0–10.5)
nRBC: 0 % (ref 0.0–0.2)

## 2021-11-10 LAB — HIV ANTIBODY (ROUTINE TESTING W REFLEX): HIV Screen 4th Generation wRfx: NONREACTIVE

## 2021-11-10 LAB — BASIC METABOLIC PANEL
Anion gap: 7 (ref 5–15)
BUN: 9 mg/dL (ref 6–20)
CO2: 28 mmol/L (ref 22–32)
Calcium: 9.5 mg/dL (ref 8.9–10.3)
Chloride: 111 mmol/L (ref 98–111)
Creatinine, Ser: 0.5 mg/dL (ref 0.44–1.00)
GFR, Estimated: >60 mL/min (ref >60)
Glucose, Bld: 145 mg/dL — ABNORMAL HIGH (ref 70–99)
Potassium: 4.5 mmol/L (ref 3.5–5.1)
Sodium: 146 mmol/L — ABNORMAL HIGH (ref 135–145)

## 2021-11-10 MED ORDER — HYDROXYZINE HCL 25 MG PO TABS
25.0000 mg | ORAL_TABLET | Freq: Four times a day (QID) | ORAL | Status: DC | PRN
  Administered 2021-11-10 – 2021-11-11 (×3): 25 mg via ORAL
  Filled 2021-11-10 (×3): qty 1

## 2021-11-10 MED ORDER — HYDROCODONE-ACETAMINOPHEN 5-325 MG PO TABS
1.0000 | ORAL_TABLET | ORAL | Status: DC | PRN
  Administered 2021-11-10 – 2021-11-12 (×11): 1 via ORAL
  Filled 2021-11-10 (×11): qty 1

## 2021-11-10 NOTE — Progress Notes (Signed)
Patient ID: Norma Pierce, female   DOB: Jul 05, 1971, 51 y.o.   MRN: [id number] Minette, Manders [id number] Nov 19, 1970 Riley Nearing, MD   SUBJECTIVE: This 51 y.o. year old female is status post needle aspiration of right peritonsillar abscess with minimal cloudy mucus aspirated.  She is doing much better this morning, eating pudding and says she feels she can eat more solid food.  Pain is dramatically improved.  Medications:  Current Facility-Administered Medications  Medication Dose Route Frequency Provider Last Rate Last Admin   acetaminophen (TYLENOL) tablet 650 mg  650 mg Oral Q8H PRN Agbata, Tochukwu, MD   650 mg at 11/10/21 0501   clindamycin (CLEOCIN) IVPB 600 mg  600 mg Intravenous Q8H Agbata, Tochukwu, MD 100 mL/hr at 11/10/21 0554 600 mg at 11/10/21 0554   dexamethasone (DECADRON) injection 10 mg  10 mg Intravenous Q8H Agbata, Tochukwu, MD   10 mg at 11/10/21 0841   HYDROcodone-acetaminophen (NORCO/VICODIN) 5-325 MG per tablet 1 tablet  1 tablet Oral Q4H PRN Enzo Bi, MD       hydrOXYzine (ATARAX) tablet 25 mg  25 mg Oral Q6H PRN Enzo Bi, MD       ondansetron [facility]) tablet 4 mg  4 mg Oral Q6H PRN Agbata, Tochukwu, MD       Or   ondansetron (ZOFRAN) injection 4 mg  4 mg Intravenous Q6H PRN Agbata, Tochukwu, MD      .  Medications Prior to Admission  Medication Sig Dispense Refill   acetaminophen (TYLENOL) 650 MG CR tablet Take 650 mg by mouth every 8 (eight) hours as needed for pain.     azithromycin (ZITHROMAX) 250 MG tablet Take 250-500 mg by mouth daily.     DPH-Lido-AlHydr-MgHydr-Simeth (FIRST-MOUTHWASH BLM) SUSP Take 5 mLs by mouth every 6 (six) hours as needed for pain.     predniSONE (DELTASONE) 20 MG tablet Take 20 mg by mouth daily.      OBJECTIVE:  PHYSICAL EXAM  Vitals: Blood pressure 128/87, pulse (!) 56, temperature 97.9 F (36.6 C), resp. rate 18, height '5\' 3"'$  (1.6 m), weight 42.3 kg, SpO2 100 %.. General: Well-developed, Well-nourished in no acute  distress Mood: Mood and affect well adjusted, pleasant and cooperative. Orientation: Grossly alert and oriented. Vocal Quality: No hoarseness. Communicates verbally. head and Face: NCAT. No facial asymmetry. No visible skin lesions. No significant facial scars. No tenderness with sinus percussion. Facial strength normal and symmetric. Oral Cavity/ Oropharynx: Lips are normal with no lesions.  She is edentulous.  Posterior pharynx reveals minimal edema and inflammation of the right tonsil with no palatal swelling at this point.  Exudate is somewhat improved.  There are some mild bruising from the needle aspiration of the tonsil.  Swelling is much improved from yesterday. MEDICAL DECISION MAKING: Data Review:  Results for orders placed or performed during the hospital encounter of 11/09/21 (from the past 48 hour(s))  Group A Strep by PCR ([place] Only)     Status: None   Collection Time: 11/09/21  9:58 AM   Specimen: Throat; Sterile Swab  Result Value Ref Range   Group A Strep by PCR NOT DETECTED NOT DETECTED    Comment: Performed at [facility], [address] [place], [place] [address]  Basic metabolic panel     Status: Abnormal   Collection Time: 11/09/21 11:58 AM  Result Value Ref Range   Sodium 140 135 - 145 mmol/L   Potassium 3.2 (L) 3.5 - 5.1 mmol/L   Chloride 107  98 - 111 mmol/L   CO2 27 22 - 32 mmol/L   Glucose, Bld 114 (H) 70 - 99 mg/dL    Comment: Glucose reference range applies only to samples taken after fasting for at least 8 hours.   BUN 7 6 - 20 mg/dL   Creatinine, Ser 0.56 0.44 - 1.00 mg/dL   Calcium 8.8 (L) 8.9 - 10.3 mg/dL   GFR, Estimated >60 >60 mL/min    Comment: (NOTE) Calculated using the CKD-EPI Creatinine Equation (2021)    Anion gap 6 5 - 15    Comment: Performed at [facility], [address] [place], [place] [address]  CBC with Differential     Status: Abnormal   Collection Time: 11/09/21 11:58 AM  Result Value Ref Range   WBC 16.1  (H) 4.0 - 10.5 K/uL   RBC 3.87 3.87 - 5.11 MIL/uL   Hemoglobin 12.7 12.0 - 15.0 g/dL   HCT 38.2 36.0 - 46.0 %   MCV 98.7 80.0 - 100.0 fL   MCH 32.8 26.0 - 34.0 pg   MCHC 33.2 30.0 - 36.0 g/dL   RDW 13.2 11.5 - 15.5 %   Platelets 168 150 - 400 K/uL   nRBC 0.0 0.0 - 0.2 %   Neutrophils Relative % 89 %   Neutro Abs 14.1 (H) 1.7 - 7.7 K/uL   Lymphocytes Relative 7 %   Lymphs Abs 1.1 0.7 - 4.0 K/uL   Monocytes Relative 4 %   Monocytes Absolute 0.7 0.1 - 1.0 K/uL   Eosinophils Relative 0 %   Eosinophils Absolute 0.0 0.0 - 0.5 K/uL   Basophils Relative 0 %   Basophils Absolute 0.1 0.0 - 0.1 K/uL   Immature Granulocytes 0 %   Abs Immature Granulocytes 0.07 0.00 - 0.07 K/uL    Comment: Performed at [facility], [address], [place],  [address]  Resp Panel by RT-PCR (Flu A&B, Covid) Nasopharyngeal Swab     Status: None   Collection Time: 11/09/21  1:56 PM   Specimen: Nasopharyngeal Swab; Nasopharyngeal(NP) swabs in vial transport medium  Result Value Ref Range   SARS Coronavirus 2 by RT PCR NEGATIVE NEGATIVE    Comment: (NOTE) SARS-CoV-2 target nucleic acids are NOT DETECTED.  The SARS-CoV-2 RNA is generally detectable in upper respiratory specimens during the acute phase of infection. The lowest concentration of SARS-CoV-2 viral copies this assay can detect is 138 copies/mL. A negative result does not preclude SARS-Cov-2 infection and should not be used as the sole basis for treatment or other patient management decisions. A negative result may occur with  improper specimen collection/handling, submission of specimen other than nasopharyngeal swab, presence of viral mutation(s) within the areas targeted by this assay, and inadequate number of viral copies(<138 copies/mL). A negative result must be combined with clinical observations, patient history, and epidemiological information. The expected result is Negative.  Fact Sheet for Patients:   EntrepreneurPulse.com.au  Fact Sheet for Healthcare Providers:  IncredibleEmployment.be  This test is no t yet approved or cleared by the [place] FDA and  has been authorized for detection and/or diagnosis of SARS-CoV-2 by FDA under an Emergency Use Authorization (EUA). This EUA will remain  in effect (meaning this test can be used) for the duration of the COVID-19 declaration under Section 564(b)(1) of the Act, 21 U.S.C.section 360bbb-3(b)(1), unless the authorization is terminated  or revoked sooner.       Influenza A by PCR NEGATIVE NEGATIVE   Influenza B by  PCR NEGATIVE NEGATIVE    Comment: (NOTE) The Xpert Xpress SARS-CoV-2/FLU/RSV plus assay is intended as an aid in the diagnosis of influenza from Nasopharyngeal swab specimens and should not be used as a sole basis for treatment. Nasal washings and aspirates are unacceptable for Xpert Xpress SARS-CoV-2/FLU/RSV testing.  Fact Sheet for Patients: EntrepreneurPulse.com.au  Fact Sheet for Healthcare Providers: IncredibleEmployment.be  This test is not yet approved or cleared by the [place] FDA and has been authorized for detection and/or diagnosis of SARS-CoV-2 by FDA under an Emergency Use Authorization (EUA). This EUA will remain in effect (meaning this test can be used) for the duration of the COVID-19 declaration under Section 564(b)(1) of the Act, 21 U.S.C. section 360bbb-3(b)(1), unless the authorization is terminated or revoked.  Performed at [facility], [address] [place], [place] [address]   CBC     Status: Abnormal   Collection Time: 11/10/21  5:28 AM  Result Value Ref Range   WBC 17.5 (H) 4.0 - 10.5 K/uL   RBC 3.94 3.87 - 5.11 MIL/uL   Hemoglobin 12.8 12.0 - 15.0 g/dL   HCT 38.4 36.0 - 46.0 %   MCV 97.5 80.0 - 100.0 fL   MCH 32.5 26.0 - 34.0 pg   MCHC 33.3 30.0 - 36.0 g/dL   RDW 13.1 11.5 - 15.5 %    Platelets 186 150 - 400 K/uL   nRBC 0.0 0.0 - 0.2 %    Comment: Performed at [facility], [address], [place], [place] [address]  Basic metabolic panel     Status: Abnormal   Collection Time: 11/10/21  5:28 AM  Result Value Ref Range   Sodium 146 (H) 135 - 145 mmol/L   Potassium 4.5 3.5 - 5.1 mmol/L   Chloride 111 98 - 111 mmol/L   CO2 28 22 - 32 mmol/L   Glucose, Bld 145 (H) 70 - 99 mg/dL    Comment: Glucose reference range applies only to samples taken after fasting for at least 8 hours.   BUN 9 6 - 20 mg/dL   Creatinine, Ser 0.50 0.44 - 1.00 mg/dL   Calcium 9.5 8.9 - 10.3 mg/dL   GFR, Estimated >60 >60 mL/min    Comment: (NOTE) Calculated using the CKD-EPI Creatinine Equation (2021)    Anion gap 7 5 - 15    Comment: Performed at [facility], [address], [place], [place] [address]  . CT Soft Tissue Neck W Contrast  Result Date: 11/09/2021 CLINICAL DATA:  Sore throat and mouth pain EXAM: CT NECK WITH CONTRAST TECHNIQUE: Multidetector CT imaging of the neck was performed using the standard protocol following the bolus administration of intravenous contrast. RADIATION DOSE REDUCTION: This exam was performed according to the departmental dose-optimization program which includes automated exposure control, adjustment of the mA and/or kV according to patient size and/or use of iterative reconstruction technique. CONTRAST:  47m OMNIPAQUE IOHEXOL 300 MG/ML  SOLN COMPARISON:  Cervical spine CT 10/26/2021 FINDINGS: Pharynx and larynx: The nasal cavity and nasopharynx are unremarkable. There is swelling of the right palatine tonsils. There is a peripherally enhancing collection within the right tonsils measuring 1.1 cm by 0.7 cm by 0.6 cm (2-26, 6-38). There is an additional tubular peripherally enhancing collection in the submucosal space of the right oropharynx measuring 1.3 cm AP by 1.4 cm TV by 2.7 cm cc. This collection may be contiguous with the smaller collection  superiorly. There is associated mucosal hyperenhancement as well as fluid  tracking in the tissues of the right oropharynx inferior along the right crus of the hyoid bone and thyroid cartilage. There is a thin retropharyngeal effusion without evidence of organized retropharyngeal abscess. There is mild stranding in the right parapharyngeal space. There is no high-grade effacement of the airway, but there is slight asymmetric effacement of the right piriform sinus. The larynx is normal.  The vocal folds are normal in appearance. Salivary glands: The parotid and submandibular glands are unremarkable. Thyroid: Unremarkable. Lymph nodes: There are prominent right cervical chain lymph nodes measuring up to 1.2 cm at level IIA, increased in size from 0.8 cm on the prior study from 10/26/2021. There is a 0.8 cm left level IIA lymph node which is not significantly changed. These are likely reactive. Likely reactive. Vascular: The major vessels of the neck are unremarkable. Limited intracranial: The imaged portions of the intracranial compartment are unremarkable. Visualized orbits: The imaged globes and orbits are unremarkable. Mastoids and visualized paranasal sinuses: The imaged paranasal sinuses and mastoid air cells are clear. Skeleton: There is no acute osseous abnormality or aggressive osseous lesion. Upper chest: The imaged lung apices are clear. Other: None. IMPRESSION: 1. Evidence of tonsillitis with a 1.1 cm abscess at the level of the right palatine tonsil and an additional tubular abscess extending more inferiorly in the submucosal tissues of the right oropharynx. There is no definite connection between these two collections. 2. Associated mucosal thickening and enhancement with soft tissue swelling as well as a retropharyngeal effusion. No evidence of organized retropharyngeal abscess. 3. Prominent cervical chain lymph nodes are likely reactive. Electronically Signed   By: Valetta Mole M.D.   On: 11/09/2021  13:15  .   ASSESSMENT: Significant improvement from yesterday.  She is able to swallow now and reporting significant pain reduction.  White count is elevated today, but not surprising given the steroids we are treating her with.  This does not appear to be concerning however given her substantial clinical improvement.  PLAN: Reasonable for discharge at any point on clindamycin 300 mg p.o. 4 times daily for 10 days and a Sterapred DS 6-day taper.  No specific follow-up is needed unless she develops worsening symptoms in which case am happy to see her next week at my office.  She is not really a candidate for tonsillectomy as this was an isolated infection, no history of recurrent tonsillitis.  As for return to work, she is anxious to get back to work but I would defer to the primary service as far as recommendations given the RSV precautions.  If she is not discharged today certainly she can be at least advance to a normal diet, and the Decadron dosage could be reduced.   Riley Nearing, MD 11/10/2021 10:41 AM

## 2021-11-10 NOTE — Progress Notes (Signed)
?  Progress Note ? ? ?Patient: Norma Pierce IWP:[id number] DOB: [birth year] DOA: 11/09/2021     1 ?DOS: the patient was seen and examined on 11/10/2021 ?  ?Brief hospital course: ?No notes on file ? ?Assessment and Plan: ?* Peritonsillar abscess ?Patient presents for evaluation of painful swallowing with radiation to the right ear and imaging shows peritonsillar abscess. ?--clindamycin initiated in the ER as well as Decadron 10 mg IV every 6 ?--ENT aspirated a small amount of material but no substantial purulence after several passes, may represent early phlegmon formation given the lack of gross purulence.  ?Plan: ?--cont IV clinda ?--cont IV decadron ?--Norco PRN for pain ? ?RSV infection ?--not coughing  ?--Supportive care ? ? ? ? ? ? ? ?Subjective:  ?ENT aspirated a small amount of material from her Right peritonsillar abscess yesterday. ? ?Pt still complained of severe pain with swallowing, but improved enough to have some pureed-consistency foods. ? ? ?Physical Exam: ?Vitals:  ? 11/09/21 2101 11/10/21 0538 11/10/21 0801 11/10/21 1601  ?BP: (!) 133/91 131/84 128/87 126/85  ?Pulse: 75 65 (!) 56 75  ?Resp: '17 17 18 18  '$ ?Temp: 97.9 ?F (36.6 ?C) 97.9 ?F (36.6 ?C) 97.9 ?F (36.6 ?C) 98.1 ?F (36.7 ?C)  ?TempSrc:    Oral  ?SpO2: 98% 100% 100% 100%  ?Weight: 42.3 kg     ?Height: '5\' 3"'$  (1.6 m)     ? ? ?Constitutional: NAD, AAOx3 ?HEENT: conjunctivae and lids normal, EOMI ?CV: No cyanosis.   ?RESP: normal respiratory effort, on RA ?Neuro: II - XII grossly intact.   ?Psych: anxious mood and affect.   ? ? ?Data Reviewed: ? ?Family Communication:  ? ?Disposition: ?Status is: Inpatient ?Remains inpatient appropriate because: IV abx and IV steroid ? ? ? Planned Discharge Destination: Home ? ? ? ? ?Time spent: 35 minutes ? ?Author: ?Enzo Bi, MD ?11/10/2021 5:31 PM ? ?For on call review [email or web address].  ? ?

## 2021-11-10 NOTE — Plan of Care (Signed)
  Problem: Health Behavior/Discharge Planning: Goal: Ability to manage health-related needs will improve Outcome: Progressing   Problem: Clinical Measurements: Goal: Ability to maintain clinical measurements within normal limits will improve Outcome: Progressing   

## 2021-11-11 DIAGNOSIS — J36 Peritonsillar abscess: Secondary | ICD-10-CM | POA: Diagnosis not present

## 2021-11-11 LAB — CBC
HCT: 35.6 % — ABNORMAL LOW (ref 36.0–46.0)
Hemoglobin: 11.8 g/dL — ABNORMAL LOW (ref 12.0–15.0)
MCH: 32.1 pg (ref 26.0–34.0)
MCHC: 33.1 g/dL (ref 30.0–36.0)
MCV: 96.7 fL (ref 80.0–100.0)
Platelets: 181 10*3/uL (ref 150–400)
RBC: 3.68 MIL/uL — ABNORMAL LOW (ref 3.87–5.11)
RDW: 13.3 % (ref 11.5–15.5)
WBC: 18.4 10*3/uL — ABNORMAL HIGH (ref 4.0–10.5)
nRBC: 0 % (ref 0.0–0.2)

## 2021-11-11 LAB — BASIC METABOLIC PANEL
Anion gap: 7 (ref 5–15)
BUN: 12 mg/dL (ref 6–20)
CO2: 27 mmol/L (ref 22–32)
Calcium: 9 mg/dL (ref 8.9–10.3)
Chloride: 107 mmol/L (ref 98–111)
Creatinine, Ser: 0.39 mg/dL — ABNORMAL LOW (ref 0.44–1.00)
GFR, Estimated: >60 mL/min (ref >60)
Glucose, Bld: 130 mg/dL — ABNORMAL HIGH (ref 70–99)
Potassium: 3.9 mmol/L (ref 3.5–5.1)
Sodium: 141 mmol/L (ref 135–145)

## 2021-11-11 LAB — MAGNESIUM: Magnesium: 2.2 mg/dL (ref 1.7–2.4)

## 2021-11-11 MED ORDER — DEXAMETHASONE SODIUM PHOSPHATE 10 MG/ML IJ SOLN
5.0000 mg | Freq: Three times a day (TID) | INTRAMUSCULAR | Status: DC
  Administered 2021-11-11 – 2021-11-12 (×2): 5 mg via INTRAVENOUS
  Filled 2021-11-11 (×2): qty 1

## 2021-11-11 NOTE — Progress Notes (Signed)
?  Progress Note ? ? ?Patient: Norma Pierce UXN:[id number] DOB: 11-17-1970 DOA: 11/09/2021     2 ?DOS: the patient was seen and examined on 11/11/2021 ?  ?Brief hospital course: ?No notes on file ? ?Assessment and Plan: ?* Peritonsillar abscess ?Patient presents for evaluation of painful swallowing with radiation to the right ear and imaging shows peritonsillar abscess. ?--clindamycin initiated in the ER as well as Decadron 10 mg IV every 6 ?--ENT aspirated a small amount of material but no substantial purulence after several passes, may represent early phlegmon formation given the lack of gross purulence.  ?Plan: ?--cont IV clinda ?--taper down IV decadron to 5 mg q6h ?--Norco PRN for pain ?--dys 2 diet for softer consistency ? ?RSV infection ?--not coughing  ?--Supportive care ? ? ? ? ? ? ? ?Subjective:  ?Pt improved throat pain improved, so it's no longer like knife stabbing when she swallows.    ? ? ?Physical Exam: ?Vitals:  ? 11/10/21 2200 11/11/21 0316 11/11/21 0840 11/11/21 1550  ?BP: 121/78 138/63 (!) 160/85 132/80  ?Pulse: 69  67 (!) 58  ?Resp: '16 16 16 16  '$ ?Temp: 97.8 ?F (36.6 ?C) 97.8 ?F (36.6 ?C) 98 ?F (36.7 ?C) 98.1 ?F (36.7 ?C)  ?TempSrc: Oral Oral Oral Oral  ?SpO2: 98% 100% 100% 100%  ?Weight:      ?Height:      ? ? ?Constitutional: NAD, AAOx3 ?HEENT: conjunctivae and lids normal, EOMI ?CV: No cyanosis.   ?RESP: normal respiratory effort, on RA ?Extremities: No effusions, edema in BLE ?SKIN: warm, dry ?Neuro: II - XII grossly intact.   ?Psych: better mood and affect.  Appropriate judgement and reason ? ? ?Data Reviewed: ? ?Family Communication:  ? ?Disposition: ?Status is: Inpatient ?Remains inpatient appropriate because: IV abx and IV steroid ? ? ? Planned Discharge Destination: Home ? ? ? ? ?Time spent: 25 minutes ? ?Author: ?Enzo Bi, MD ?11/11/2021 4:22 PM ? ?For on call review [email or web address].  ? ?

## 2021-11-11 NOTE — TOC CM/SW Note (Signed)
?  Transition of Care (TOC) Screening Note ? ? ?Patient Details  ?Name: Norma Pierce ?Date of Birth: 1970-11-02 ? ? ?Transition of Care (TOC) CM/SW Contact:    ?Annalyssa Thune E Brentlee Delage, LCSW ?Phone Number: ?11/11/2021, 11:52 AM ? ? ? ?Transition of Care Department [facility]) has reviewed patient and no TOC needs have been identified at this time. We will continue to monitor patient advancement through interdisciplinary progression rounds. If new patient transition needs arise, please place a TOC consult. ? ? ?

## 2021-11-11 NOTE — Plan of Care (Signed)
  Problem: Health Behavior/Discharge Planning: Goal: Ability to manage health-related needs will improve Outcome: Progressing   Problem: Clinical Measurements: Goal: Ability to maintain clinical measurements within normal limits will improve Outcome: Progressing   

## 2021-11-12 LAB — CBC
HCT: 34.7 % — ABNORMAL LOW (ref 36.0–46.0)
Hemoglobin: 11.5 g/dL — ABNORMAL LOW (ref 12.0–15.0)
MCH: 31.9 pg (ref 26.0–34.0)
MCHC: 33.1 g/dL (ref 30.0–36.0)
MCV: 96.1 fL (ref 80.0–100.0)
Platelets: 194 10*3/uL (ref 150–400)
RBC: 3.61 MIL/uL — ABNORMAL LOW (ref 3.87–5.11)
RDW: 13.2 % (ref 11.5–15.5)
WBC: 12.7 10*3/uL — ABNORMAL HIGH (ref 4.0–10.5)
nRBC: 0 % (ref 0.0–0.2)

## 2021-11-12 LAB — BASIC METABOLIC PANEL
Anion gap: 8 (ref 5–15)
BUN: 12 mg/dL (ref 6–20)
CO2: 28 mmol/L (ref 22–32)
Calcium: 9 mg/dL (ref 8.9–10.3)
Chloride: 105 mmol/L (ref 98–111)
Creatinine, Ser: 0.49 mg/dL (ref 0.44–1.00)
GFR, Estimated: >60 mL/min (ref >60)
Glucose, Bld: 128 mg/dL — ABNORMAL HIGH (ref 70–99)
Potassium: 4 mmol/L (ref 3.5–5.1)
Sodium: 141 mmol/L (ref 135–145)

## 2021-11-12 LAB — MAGNESIUM: Magnesium: 2.2 mg/dL (ref 1.7–2.4)

## 2021-11-12 MED ORDER — HYDROCODONE-ACETAMINOPHEN 5-325 MG PO TABS: 1.0000 | ORAL_TABLET | Freq: Four times a day (QID) | ORAL | 0 refills | Status: AC | PRN

## 2021-11-12 MED ORDER — CLINDAMYCIN HCL 150 MG PO CAPS
600.0000 mg | ORAL_CAPSULE | Freq: Three times a day (TID) | ORAL | Status: DC
Start: 2021-11-12 — End: 2021-11-12
  Administered 2021-11-12: 600 mg via ORAL
  Filled 2021-11-12 (×3): qty 4

## 2021-11-12 MED ORDER — CLINDAMYCIN HCL 300 MG PO CAPS
600.0000 mg | ORAL_CAPSULE | Freq: Three times a day (TID) | ORAL | 0 refills | Status: AC
Start: 2021-11-12 — End: 2021-11-18

## 2021-11-12 MED ORDER — DEXAMETHASONE 6 MG PO TABS
6.0000 mg | ORAL_TABLET | Freq: Every day | ORAL | 0 refills | Status: AC
Start: 2021-11-13 — End: 2021-11-18

## 2021-11-12 MED ORDER — DEXAMETHASONE 6 MG PO TABS
6.0000 mg | ORAL_TABLET | Freq: Every day | ORAL | Status: DC
  Administered 2021-11-12: 6 mg via ORAL
  Filled 2021-11-12: qty 1

## 2021-11-12 NOTE — Plan of Care (Signed)
?  Problem: Education: ?Goal: Knowledge of General Education information will improve ?Description: Including pain rating scale, medication(s)/side effects and non-pharmacologic comfort measures ?11/12/2021 1049 by Evelena Peat, RN ?Outcome: Completed/Met ?11/12/2021 0953 by Evelena Peat, RN ?Outcome: Progressing ?  ?Problem: Health Behavior/Discharge Planning: ?Goal: Ability to manage health-related needs will improve ?11/12/2021 1049 by Evelena Peat, RN ?Outcome: Completed/Met ?11/12/2021 0953 by Evelena Peat, RN ?Outcome: Progressing ?  ?Problem: Clinical Measurements: ?Goal: Ability to maintain clinical measurements within normal limits will improve ?11/12/2021 1049 by Evelena Peat, RN ?Outcome: Completed/Met ?11/12/2021 0953 by Evelena Peat, RN ?Outcome: Progressing ?Goal: Will remain free from infection ?11/12/2021 1049 by Evelena Peat, RN ?Outcome: Completed/Met ?11/12/2021 0953 by Evelena Peat, RN ?Outcome: Progressing ?Goal: Diagnostic test results will improve ?11/12/2021 1049 by Evelena Peat, RN ?Outcome: Completed/Met ?11/12/2021 0953 by Evelena Peat, RN ?Outcome: Progressing ?Goal: Respiratory complications will improve ?11/12/2021 1049 by Evelena Peat, RN ?Outcome: Completed/Met ?11/12/2021 0953 by Evelena Peat, RN ?Outcome: Progressing ?Goal: Cardiovascular complication will be avoided ?11/12/2021 1049 by Evelena Peat, RN ?Outcome: Completed/Met ?11/12/2021 0953 by Evelena Peat, RN ?Outcome: Progressing ?  ?Problem: Activity: ?Goal: Risk for activity intolerance will decrease ?11/12/2021 1049 by Evelena Peat, RN ?Outcome: Completed/Met ?11/12/2021 0953 by Evelena Peat, RN ?Outcome: Progressing ?  ?Problem: Nutrition: ?Goal: Adequate nutrition will be maintained ?11/12/2021 1049 by Evelena Peat, RN ?Outcome: Completed/Met ?11/12/2021 0953 by Evelena Peat, RN ?Outcome: Progressing ?  ?Problem: Coping: ?Goal: Level of anxiety will  decrease ?11/12/2021 1049 by Evelena Peat, RN ?Outcome: Completed/Met ?11/12/2021 0953 by Evelena Peat, RN ?Outcome: Progressing ?  ?Problem: Elimination: ?Goal: Will not experience complications related to bowel motility ?11/12/2021 1049 by Evelena Peat, RN ?Outcome: Completed/Met ?11/12/2021 0953 by Evelena Peat, RN ?Outcome: Progressing ?Goal: Will not experience complications related to urinary retention ?11/12/2021 1049 by Evelena Peat, RN ?Outcome: Completed/Met ?11/12/2021 0953 by Evelena Peat, RN ?Outcome: Progressing ?  ?Problem: Pain Managment: ?Goal: General experience of comfort will improve ?11/12/2021 1049 by Evelena Peat, RN ?Outcome: Completed/Met ?11/12/2021 0953 by Evelena Peat, RN ?Outcome: Progressing ?  ?Problem: Safety: ?Goal: Ability to remain free from injury will improve ?11/12/2021 1049 by Evelena Peat, RN ?Outcome: Completed/Met ?11/12/2021 0953 by Evelena Peat, RN ?Outcome: Progressing ?  ?Problem: Skin Integrity: ?Goal: Risk for impaired skin integrity will decrease ?11/12/2021 1049 by Evelena Peat, RN ?Outcome: Completed/Met ?11/12/2021 0953 by Evelena Peat, RN ?Outcome: Progressing ?  ?

## 2021-11-12 NOTE — Discharge Summary (Signed)
Physician Discharge Summary   Norma Pierce  female DOB: May 04, 1971  IOM:[id number]  PCP: Pcp, No  Admit date: 11/09/2021 Discharge date: 11/12/2021  Admitted From: home Disposition:  home CODE STATUS: Full code  Discharge Instructions     Discharge instructions   Complete by: As directed    For you left Peritonsillar abscess, please finish 6 more days of antibiotic clindamycin and 5 more days of steroid Decadron.    ENT Dr. Richardson Landry said you only need to follow up with him if you have worsening symptoms.   Dr. Enzo Bi [facility] Course:  For full details, please see H&P, progress notes, consult notes and ancillary notes.  Briefly,  Norma Pierce is a 51 y.o. female with medical history significant for cervical cancer who presented to the ER for evaluation of several days of pain with swallowing which got worse 24 hours prior to her presentation and is associated with drooling and inability to swallow.  Patient states her symptoms started about 5 days ago with a scratchy throat.  Pain is described as a sharp pain and rated an 8 x 10 in intensity at its worst with radiation to her right ear. She denies having any fever or chills but due to the severity of her symptoms she was seen at the urgent care and prescribed erythromycin and prednisone she has been unable to take due to painful swallowing.  She had a viral panel done at the urgent care and tested positive for RSV.  CT soft tissue neck showed evidence of tonsillitis with a 1.1 cm abscess at the level of the right palatine tonsil and an additional tubular abscess extending more inferiorly in the submucosal tissues of the right oropharynx. There is no definite connection between these two collections.  * Peritonsillar abscess --clindamycin initiated in the ER as well as Decadron 10 mg IV every q6h. --ENT aspirated a small amount of material but no substantial purulence after several passes, may represent  early phlegmon formation given the lack of gross purulence.  --symptoms improved and pt was able to tolerate soft diet prior to discharge. --Pt was discharged on 6 more days of oral clindamycin and 5 more days of oral Decadron 6 mg daily. --Per ENT Dr. Richardson Landry, outpatient f/u only if pt has worsening symptoms.   RSV infection --not coughing  --Supportive care   Discharge Diagnoses:  Principal Problem:   Peritonsillar abscess Active Problems:   RSV infection   30 Day Unplanned Readmission Risk Score    Flowsheet Row ED to Hosp-Admission (Current) from 11/09/2021 in [place]  30 Day Unplanned Readmission Risk Score (%) 8.01 Filed at 11/12/2021 0801       This score is the patient's risk of an unplanned readmission within 30 days of being discharged (0 -100%). The score is based on dignosis, age, lab data, medications, orders, and past utilization.   Low:  0-14.9   Medium: 15-21.9   High: 22-29.9   Extreme: 30 and above         Discharge Instructions:  Allergies as of 11/12/2021       Reactions   Penicillins Anaphylaxis, Hives   Has patient had a PCN reaction causing immediate rash, facial/tongue/throat swelling, SOB or lightheadedness with hypotension: Yes Has patient had a PCN reaction causing severe rash involving mucus membranes or skin necrosis: No Has patient had a PCN reaction that required hospitalization: Yes Has patient  had a PCN reaction occurring within the last 10 years: No If all of the above answers are "NO", then may proceed with Cephalosporin use.        Medication List     STOP taking these medications    azithromycin 250 MG tablet Commonly known as: ZITHROMAX   predniSONE 20 MG tablet Commonly known as: DELTASONE       TAKE these medications    acetaminophen 650 MG CR tablet Commonly known as: TYLENOL Take 650 mg by mouth every 8 (eight) hours as needed for pain.   clindamycin 300 MG  capsule Commonly known as: CLEOCIN Take 2 capsules (600 mg total) by mouth 3 (three) times daily for 6 days.   dexamethasone 6 MG tablet Commonly known as: DECADRON Take 1 tablet (6 mg total) by mouth daily for 5 days. Start taking on: November 13, 2021   First-Mouthwash BLM Susp Take 5 mLs by mouth every 6 (six) hours as needed for pain.   HYDROcodone-acetaminophen 5-325 MG tablet Commonly known as: NORCO/VICODIN Take 1 tablet by mouth every 6 (six) hours as needed for up to 5 days for moderate pain or severe pain.         Follow-up Information     Clyde Canterbury, MD Follow up.   Specialty: Otolaryngology Why: As needed Contact information: [place] [address] [phone]                 Allergies  Allergen Reactions   Penicillins Anaphylaxis and Hives    Has patient had a PCN reaction causing immediate rash, facial/tongue/throat swelling, SOB or lightheadedness with hypotension: Yes Has patient had a PCN reaction causing severe rash involving mucus membranes or skin necrosis: No Has patient had a PCN reaction that required hospitalization: Yes Has patient had a PCN reaction occurring within the last 10 years: No If all of the above answers are "NO", then may proceed with Cephalosporin use.      The results of significant diagnostics from this hospitalization (including imaging, microbiology, ancillary and laboratory) are listed below for reference.   Consultations:   Procedures/Studies: CT Head Wo Contrast  Result Date: 10/26/2021 CLINICAL DATA:  Fall today with head injury. No loss of consciousness. EXAM: CT HEAD WITHOUT CONTRAST CT CERVICAL SPINE WITHOUT CONTRAST TECHNIQUE: Multidetector CT imaging of the head and cervical spine was performed following the standard protocol without intravenous contrast. Multiplanar CT image reconstructions of the cervical spine were also generated. RADIATION DOSE REDUCTION: This exam  was performed according to the departmental dose-optimization program which includes automated exposure control, adjustment of the mA and/or kV according to patient size and/or use of iterative reconstruction technique. COMPARISON:  Prior study 03/12/2020. FINDINGS: CT HEAD FINDINGS Brain: There is no evidence of acute intracranial hemorrhage, mass lesion, brain edema or extra-axial fluid collection. The ventricles and subarachnoid spaces are appropriately sized for age. There is no CT evidence of acute cortical infarction. Vascular:  No hyperdense vessel identified. Skull: Negative for fracture or focal lesion. Sinuses/Orbits: The visualized paranasal sinuses and mastoid air cells are clear. No orbital abnormalities are seen. Other: None. CT CERVICAL SPINE FINDINGS Alignment: Stable mild straightening. No focal angulation or listhesis. Skull base and vertebrae: No evidence of acute cervical spine fracture or traumatic subluxation. Soft tissues and spinal canal: No prevertebral fluid or swelling. No visible canal hematoma. Stable calcifications within the palatine tonsils bilaterally. Disc levels: Stable spondylosis which is greatest at C5-6 with disc space narrowing  and uncinate spurring contributing to foraminal narrowing bilaterally. No large disc herniation identified. Upper chest: Mild biapical scarring. Other: None. IMPRESSION: 1. No acute intracranial or calvarial findings. 2. No evidence of acute cervical spine fracture, traumatic subluxation or static signs of instability. Stable mild spondylosis. Electronically Signed   By: Richardean Sale M.D.   On: 10/26/2021 10:46   CT Soft Tissue Neck W Contrast  Result Date: 11/09/2021 CLINICAL DATA:  Sore throat and mouth pain EXAM: CT NECK WITH CONTRAST TECHNIQUE: Multidetector CT imaging of the neck was performed using the standard protocol following the bolus administration of intravenous contrast. RADIATION DOSE REDUCTION: This exam was performed according to  the departmental dose-optimization program which includes automated exposure control, adjustment of the mA and/or kV according to patient size and/or use of iterative reconstruction technique. CONTRAST:  30m OMNIPAQUE IOHEXOL 300 MG/ML  SOLN COMPARISON:  Cervical spine CT 10/26/2021 FINDINGS: Pharynx and larynx: The nasal cavity and nasopharynx are unremarkable. There is swelling of the right palatine tonsils. There is a peripherally enhancing collection within the right tonsils measuring 1.1 cm by 0.7 cm by 0.6 cm (2-26, 6-38). There is an additional tubular peripherally enhancing collection in the submucosal space of the right oropharynx measuring 1.3 cm AP by 1.4 cm TV by 2.7 cm cc. This collection may be contiguous with the smaller collection superiorly. There is associated mucosal hyperenhancement as well as fluid tracking in the tissues of the right oropharynx inferior along the right crus of the hyoid bone and thyroid cartilage. There is a thin retropharyngeal effusion without evidence of organized retropharyngeal abscess. There is mild stranding in the right parapharyngeal space. There is no high-grade effacement of the airway, but there is slight asymmetric effacement of the right piriform sinus. The larynx is normal.  The vocal folds are normal in appearance. Salivary glands: The parotid and submandibular glands are unremarkable. Thyroid: Unremarkable. Lymph nodes: There are prominent right cervical chain lymph nodes measuring up to 1.2 cm at level IIA, increased in size from 0.8 cm on the prior study from 10/26/2021. There is a 0.8 cm left level IIA lymph node which is not significantly changed. These are likely reactive. Likely reactive. Vascular: The major vessels of the neck are unremarkable. Limited intracranial: The imaged portions of the intracranial compartment are unremarkable. Visualized orbits: The imaged globes and orbits are unremarkable. Mastoids and visualized paranasal sinuses: The imaged  paranasal sinuses and mastoid air cells are clear. Skeleton: There is no acute osseous abnormality or aggressive osseous lesion. Upper chest: The imaged lung apices are clear. Other: None. IMPRESSION: 1. Evidence of tonsillitis with a 1.1 cm abscess at the level of the right palatine tonsil and an additional tubular abscess extending more inferiorly in the submucosal tissues of the right oropharynx. There is no definite connection between these two collections. 2. Associated mucosal thickening and enhancement with soft tissue swelling as well as a retropharyngeal effusion. No evidence of organized retropharyngeal abscess. 3. Prominent cervical chain lymph nodes are likely reactive. Electronically Signed   By: PValetta MoleM.D.   On: 11/09/2021 13:15   CT CERVICAL SPINE WO CONTRAST  Result Date: 10/26/2021 CLINICAL DATA:  Fall today with head injury. No loss of consciousness. EXAM: CT HEAD WITHOUT CONTRAST CT CERVICAL SPINE WITHOUT CONTRAST TECHNIQUE: Multidetector CT imaging of the head and cervical spine was performed following the standard protocol without intravenous contrast. Multiplanar CT image reconstructions of the cervical spine were also generated. RADIATION DOSE REDUCTION: This exam was performed  according to the departmental dose-optimization program which includes automated exposure control, adjustment of the mA and/or kV according to patient size and/or use of iterative reconstruction technique. COMPARISON:  Prior study 03/12/2020. FINDINGS: CT HEAD FINDINGS Brain: There is no evidence of acute intracranial hemorrhage, mass lesion, brain edema or extra-axial fluid collection. The ventricles and subarachnoid spaces are appropriately sized for age. There is no CT evidence of acute cortical infarction. Vascular:  No hyperdense vessel identified. Skull: Negative for fracture or focal lesion. Sinuses/Orbits: The visualized paranasal sinuses and mastoid air cells are clear. No orbital abnormalities are  seen. Other: None. CT CERVICAL SPINE FINDINGS Alignment: Stable mild straightening. No focal angulation or listhesis. Skull base and vertebrae: No evidence of acute cervical spine fracture or traumatic subluxation. Soft tissues and spinal canal: No prevertebral fluid or swelling. No visible canal hematoma. Stable calcifications within the palatine tonsils bilaterally. Disc levels: Stable spondylosis which is greatest at C5-6 with disc space narrowing and uncinate spurring contributing to foraminal narrowing bilaterally. No large disc herniation identified. Upper chest: Mild biapical scarring. Other: None. IMPRESSION: 1. No acute intracranial or calvarial findings. 2. No evidence of acute cervical spine fracture, traumatic subluxation or static signs of instability. Stable mild spondylosis. Electronically Signed   By: Richardean Sale M.D.   On: 10/26/2021 10:46      Labs: BNP (last 3 results) No results for input(s): BNP in the last 8760 hours. Basic Metabolic Panel: Recent Labs  Lab 11/09/21 1158 11/10/21 0528 11/11/21 0434 11/12/21 0437  NA 140 146* 141 141  K 3.2* 4.5 3.9 4.0  CL 107 111 107 105  CO2 '27 28 27 28  '$ GLUCOSE 114* 145* 130* 128*  BUN '7 9 12 12  '$ CREATININE 0.56 0.50 0.39* 0.49  CALCIUM 8.8* 9.5 9.0 9.0  MG  --   --  2.2 2.2   Liver Function Tests: No results for input(s): AST, ALT, ALKPHOS, BILITOT, PROT, ALBUMIN in the last 168 hours. No results for input(s): LIPASE, AMYLASE in the last 168 hours. No results for input(s): AMMONIA in the last 168 hours. CBC: Recent Labs  Lab 11/09/21 1158 11/10/21 0528 11/11/21 0434 11/12/21 0437  WBC 16.1* 17.5* 18.4* 12.7*  NEUTROABS 14.1*  --   --   --   HGB 12.7 12.8 11.8* 11.5*  HCT 38.2 38.4 35.6* 34.7*  MCV 98.7 97.5 96.7 96.1  PLT 168 186 181 194   Cardiac Enzymes: No results for input(s): CKTOTAL, CKMB, CKMBINDEX, TROPONINI in the last 168 hours. BNP: Invalid input(s): POCBNP CBG: No results for input(s): GLUCAP in  the last 168 hours. D-Dimer No results for input(s): DDIMER in the last 72 hours. Hgb A1c No results for input(s): HGBA1C in the last 72 hours. Lipid Profile No results for input(s): CHOL, HDL, LDLCALC, TRIG, CHOLHDL, LDLDIRECT in the last 72 hours. Thyroid function studies No results for input(s): TSH, T4TOTAL, T3FREE, THYROIDAB in the last 72 hours.  Invalid input(s): FREET3 Anemia work up No results for input(s): VITAMINB12, FOLATE, FERRITIN, TIBC, IRON, RETICCTPCT in the last 72 hours. Urinalysis    Component Value Date/Time   COLORURINE STRAW (A) 03/17/2017 1114   APPEARANCEUR CLEAR 03/17/2017 1114   LABSPEC 1.008 03/17/2017 1114   PHURINE 8.0 03/17/2017 1114   GLUCOSEU NEGATIVE 03/17/2017 1114   HGBUR NEGATIVE 03/17/2017 1114   BILIRUBINUR NEGATIVE 03/17/2017 [place] 03/17/2017 1114   PROTEINUR NEGATIVE 03/17/2017 1114   NITRITE NEGATIVE 03/17/2017 1114   LEUKOCYTESUR NEGATIVE 03/17/2017 1114  Sepsis Labs Invalid input(s): PROCALCITONIN,  WBC,  LACTICIDVEN Microbiology Recent Results (from the past 240 hour(s))  Group A Strep by PCR ([place] Only)     Status: None   Collection Time: 11/09/21  9:58 AM   Specimen: Throat; Sterile Swab  Result Value Ref Range Status   Group A Strep by PCR NOT DETECTED NOT DETECTED Final    Comment: Performed at [facility], [address] [place], [place] [address]  Resp Panel by RT-PCR (Flu A&B, Covid) Nasopharyngeal Swab     Status: None   Collection Time: 11/09/21  1:56 PM   Specimen: Nasopharyngeal Swab; Nasopharyngeal(NP) swabs in vial transport medium  Result Value Ref Range Status   SARS Coronavirus 2 by RT PCR NEGATIVE NEGATIVE Final    Comment: (NOTE) SARS-CoV-2 target nucleic acids are NOT DETECTED.  The SARS-CoV-2 RNA is generally detectable in upper respiratory specimens during the acute phase of infection. The lowest concentration of SARS-CoV-2 viral copies this assay can detect is 138  copies/mL. A negative result does not preclude SARS-Cov-2 infection and should not be used as the sole basis for treatment or other patient management decisions. A negative result may occur with  improper specimen collection/handling, submission of specimen other than nasopharyngeal swab, presence of viral mutation(s) within the areas targeted by this assay, and inadequate number of viral copies(<138 copies/mL). A negative result must be combined with clinical observations, patient history, and epidemiological information. The expected result is Negative.  Fact Sheet for Patients:  EntrepreneurPulse.com.au  Fact Sheet for Healthcare Providers:  IncredibleEmployment.be  This test is no t yet approved or cleared by the [place] FDA and  has been authorized for detection and/or diagnosis of SARS-CoV-2 by FDA under an Emergency Use Authorization (EUA). This EUA will remain  in effect (meaning this test can be used) for the duration of the COVID-19 declaration under Section 564(b)(1) of the Act, 21 U.S.C.section 360bbb-3(b)(1), unless the authorization is terminated  or revoked sooner.       Influenza A by PCR NEGATIVE NEGATIVE Final   Influenza B by PCR NEGATIVE NEGATIVE Final    Comment: (NOTE) The Xpert Xpress SARS-CoV-2/FLU/RSV plus assay is intended as an aid in the diagnosis of influenza from Nasopharyngeal swab specimens and should not be used as a sole basis for treatment. Nasal washings and aspirates are unacceptable for Xpert Xpress SARS-CoV-2/FLU/RSV testing.  Fact Sheet for Patients: EntrepreneurPulse.com.au  Fact Sheet for Healthcare Providers: IncredibleEmployment.be  This test is not yet approved or cleared by the [place] FDA and has been authorized for detection and/or diagnosis of SARS-CoV-2 by FDA under an Emergency Use Authorization (EUA). This EUA will remain in effect (meaning  this test can be used) for the duration of the COVID-19 declaration under Section 564(b)(1) of the Act, 21 U.S.C. section 360bbb-3(b)(1), unless the authorization is terminated or revoked.  Performed at [facility], [address] [place], [place] [address]      Total time spend on discharging this patient, including the last patient exam, discussing the hospital stay, instructions for ongoing care as it relates to all pertinent caregivers, as well as preparing the medical discharge records, prescriptions, and/or referrals as applicable, is 35 minutes.    Enzo Bi, MD  [facility] 11/12/2021, 8:25 AM

## 2021-11-12 NOTE — Plan of Care (Signed)

## 2021-11-12 NOTE — Progress Notes (Signed)
Patient discharged to home via self/private transfer with all belongings. Patient given discharge instructions and expressed full understanding. Medications sent to [facility] per patient request and patient instructed on how to take medications. PIVX1 removed with catheter intact. Follow-up appointments made. Patient VSS on discharge. No questions or concerns. ?

## 2021-11-18 ENCOUNTER — Emergency Department: Payer: Medicaid Other

## 2021-11-18 ENCOUNTER — Encounter: Payer: Self-pay | Admitting: Emergency Medicine

## 2021-11-18 ENCOUNTER — Emergency Department
Admission: EM | Admit: 2021-11-18 | Discharge: 2021-11-18 | Disposition: A | Discharge status: Home / Self Care | Payer: Medicaid Other | Attending: Emergency Medicine | Admitting: Emergency Medicine

## 2021-11-18 DIAGNOSIS — K5900 Constipation, unspecified: Secondary | ICD-10-CM | POA: Insufficient documentation

## 2021-11-18 DIAGNOSIS — R140 Abdominal distension (gaseous): Secondary | ICD-10-CM | POA: Diagnosis present

## 2021-11-18 DIAGNOSIS — Z8541 Personal history of malignant neoplasm of cervix uteri: Secondary | ICD-10-CM | POA: Insufficient documentation

## 2021-11-18 MED ORDER — POLYETHYLENE GLYCOL 3350 17 GM/SCOOP PO POWD: ORAL | 0 refills | Status: AC

## 2021-11-18 NOTE — ED Triage Notes (Signed)
Pt reports she was discharged from hospital on Monday and on Wednesday she noticed swelling to the abdomen. Pt started taking stool softener and has had 2 BM since. Pt to ED due to continued swelling. Pt denies SOB. (Hospitalization due to abscess of throat and RSV) ?

## 2021-11-18 NOTE — ED Provider Notes (Signed)
? ?  [facility] ?Provider Note ? ? ? Event Date/Time  ? First MD Initiated Contact with Patient 11/18/21 1516   ?  (approximate) ? ? ?History  ? ?Bloated ? ? ?HPI ? ?Norma Pierce is a 51 y.o. female with a history of ovarian cyst and cervical cancer who comes to the ED complaining abdominal bloating.  Denies any pain or vomiting. ? ?She was recently treated for peritonsillar abscess, was prescribed opioids for pain control after incision and drainage.  After this, she developed constipation, and started Colace.  She did have 1 bowel movement today and 1 yesterday. ? ?No cp/sob/f/c. No dysuria. No vaginal bleeding. S/p hysterectomy ? ?   ? ? ?Physical Exam  ? ?Triage Vital Signs: ?ED Triage Vitals [11/18/21 1508]  ?Enc Vitals Group  ?   BP 140/83  ?   Pulse Rate 64  ?   Resp 17  ?   Temp 98.4 ?F (36.9 ?C)  ?   Temp Source Oral  ?   SpO2 99 %  ?   Weight   ?   Height   ?   Head Circumference   ?   Peak Flow   ?   Pain Score   ?   Pain Loc   ?   Pain Edu?   ?   Excl. in [place]?   ? ? ?Most recent vital signs: ?Vitals:  ? 11/18/21 1508  ?BP: 140/83  ?Pulse: 64  ?Resp: 17  ?Temp: 98.4 ?F (36.9 ?C)  ?SpO2: 99%  ? ? ? ?General: Awake, no distress.  ?CV:  Good peripheral perfusion. RRR ?Resp:  Normal effort. ctab ?Abd:  No distention. Soft, nontender. No mass ?Other:  No LE edema, no exopthalmos or goiter. No icterus/jaundice ? ? ?ED Results / Procedures / Treatments  ? ?Labs ?(all labs ordered are listed, but only abnormal results are displayed) ?Labs Reviewed - No data to display ? ? ?EKG ? ? ? ? ?RADIOLOGY ?XR KUB viewed and interpreted by me, shows large colonic stool burden. Radiology report reviewed ? ? ? ?PROCEDURES: ? ?Critical Care performed: no ? ?Procedures ? ? ?MEDICATIONS ORDERED IN ED: ?Medications - No data to display ? ? ?IMPRESSION / MDM / ASSESSMENT AND PLAN / ED COURSE  ?I reviewed the triage vital signs and the nursing notes. ?             ?               ? ? ?Pt p/w abd bloating.  Sx, exam, and XR c/w constipation. Doubt SBO, perforation, ascities/liver failure, renal insuffiency, diverticulitis, abd abscess, ovarian cyst/mass. Recommend miralax bid. ? ? ? ? ? ?  ? ? ?FINAL CLINICAL IMPRESSION(S) / ED DIAGNOSES  ? ?Final diagnoses:  ?Constipation, unspecified constipation type  ? ? ? ?Rx / DC Orders  ? ?ED Discharge Orders   ? ?      Ordered  ?  polyethylene glycol powder (GLYCOLAX/MIRALAX) 17 GM/SCOOP powder       ? 11/18/21 1700  ? ?  ?  ? ?  ? ? ? ?Note:  This document was prepared using Dragon voice recognition software and may include unintentional dictation errors. ?  ?Carrie Mew, MD ?11/18/21 1715 ? ?

## 2021-11-29 ENCOUNTER — Emergency Department
Admission: EM | Admit: 2021-11-29 | Discharge: 2021-11-29 | Disposition: A | Discharge status: Home / Self Care | Payer: Medicaid Other | Attending: Emergency Medicine | Admitting: Emergency Medicine

## 2021-11-29 ENCOUNTER — Encounter: Payer: Self-pay | Admitting: Emergency Medicine

## 2021-11-29 ENCOUNTER — Other Ambulatory Visit: Payer: Self-pay

## 2021-11-29 DIAGNOSIS — W268XXA Contact with other sharp object(s), not elsewhere classified, initial encounter: Secondary | ICD-10-CM | POA: Diagnosis not present

## 2021-11-29 DIAGNOSIS — Z5321 Procedure and treatment not carried out due to patient leaving prior to being seen by health care provider: Secondary | ICD-10-CM | POA: Insufficient documentation

## 2021-11-29 DIAGNOSIS — S61217A Laceration without foreign body of left little finger without damage to nail, initial encounter: Secondary | ICD-10-CM | POA: Diagnosis not present

## 2021-11-29 DIAGNOSIS — S60947A Unspecified superficial injury of left little finger, initial encounter: Secondary | ICD-10-CM | POA: Diagnosis present

## 2021-11-29 NOTE — ED Notes (Signed)
States she has just gotten a call that she has a family emergency  she she was leaving ? ?

## 2021-11-29 NOTE — ED Triage Notes (Signed)
Pt via POV from home. Pt c/o laceration to the L pinky states that her tetanus is up to date. Pt cut her pinky on a box cutter. Pt is A&Ox4 and NAD.  ?

## 2022-03-19 IMAGING — CR DG ABDOMEN 1V
2 series · 2 of 2 positions shown · non-contrast
Comparison: 03/17/2017 CT

CLINICAL DATA: Abdominal bloating and constipation.

EXAM:
ABDOMEN - 1 VIEW

[abdomen kub (1 of 2)]
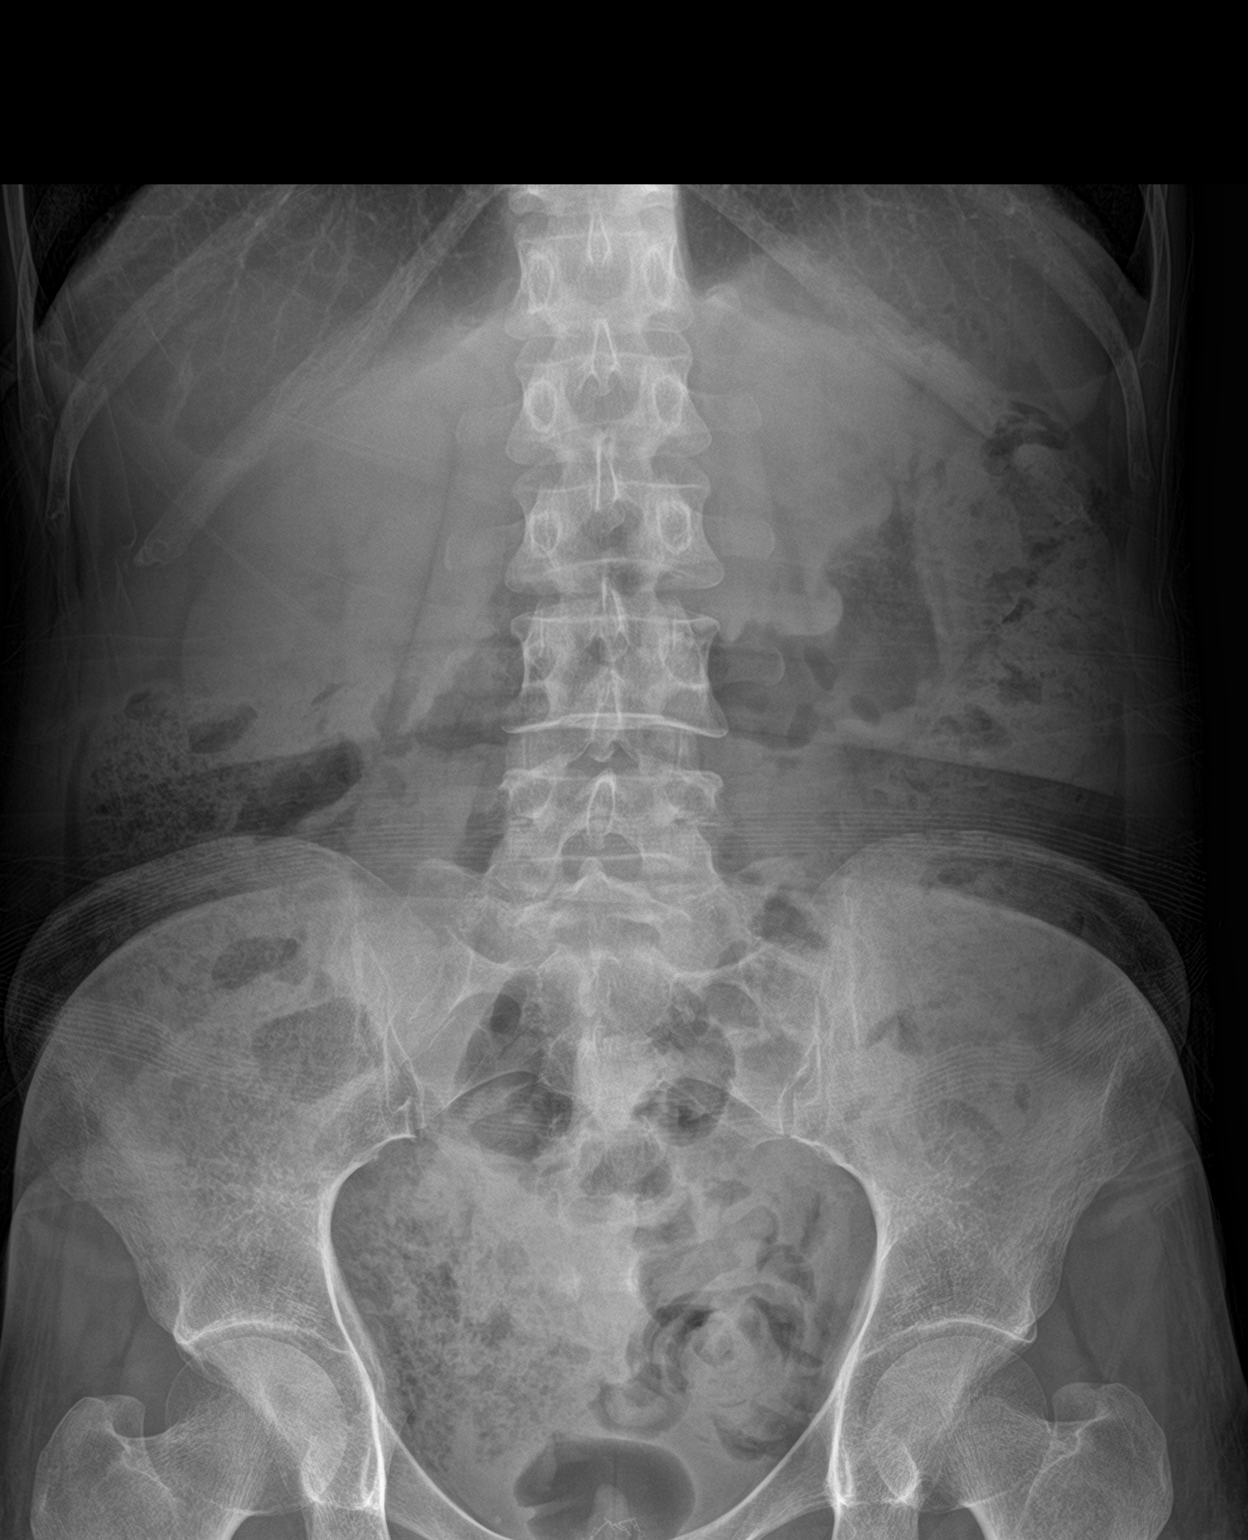

[abdomen kub (2 of 2)]
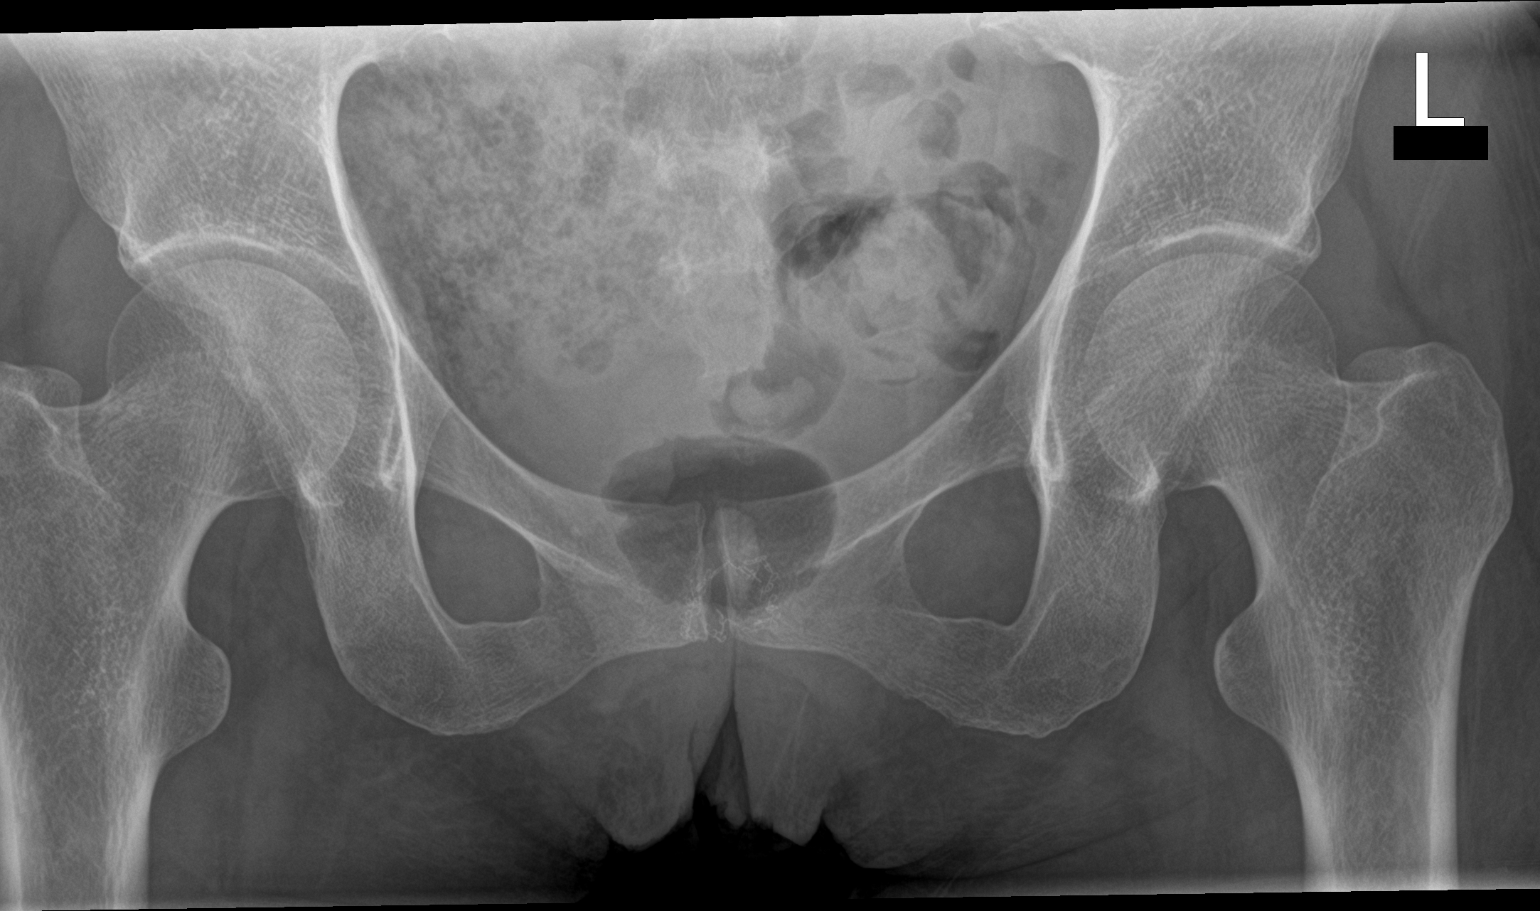

[2 of 2 positions shown; findings below may reference images not displayed]

FINDINGS: Two supine views of the abdomen and pelvis. Moderate to large amount
of colonic stool. No gaseous distention of bowel loops. No abnormal
abdominal calcifications. No appendicolith. Distal gas and stool.
IMPRESSION: No acute findings.

Possible constipation.

## 2022-08-09 ENCOUNTER — Emergency Department
Admission: EM | Admit: 2022-08-09 | Discharge: 2022-08-09 | Disposition: A | Discharge status: Home / Self Care | Payer: Medicaid Other | Attending: Physician Assistant | Admitting: Physician Assistant

## 2022-08-09 ENCOUNTER — Other Ambulatory Visit: Payer: Self-pay

## 2022-08-09 DIAGNOSIS — J029 Acute pharyngitis, unspecified: Secondary | ICD-10-CM | POA: Insufficient documentation

## 2022-08-09 DIAGNOSIS — Z5321 Procedure and treatment not carried out due to patient leaving prior to being seen by health care provider: Secondary | ICD-10-CM | POA: Insufficient documentation

## 2022-08-09 LAB — GROUP A STREP BY PCR: Group A Strep by PCR: NOT DETECTED

## 2022-08-09 NOTE — ED Triage Notes (Signed)
Pt comes with c/o abscess in throat. Pt states she noticed it two days ago. Pt was sent over by [facility] to get it further evaluated.

## 2022-08-09 NOTE — ED Provider Triage Note (Signed)
Emergency Medicine Provider Triage Evaluation Note  EDOM SCHMUHL, a 51 y.o. female  was evaluated in triage.  Pt complains of sore throat.  Patient would endorse 3 days of sore throat pain.  She initially presented to a local urgent care, who advised that she report to the ED after they noted what they described as a "abscess" to her throat.  Patient reports she was shown an image of her throat and saw what looked like a white spot.  Patient denies any fevers, chills, or sweats but she reports normal control of oral secretions and denies any N/V.  She has remote history of a peritonsillar abscess in March with subsequent hospital admission.  Review of Systems  Positive: Sore throat Negative: FCS  Physical Exam  BP (!) 160/105 (BP Location: Left Arm)   Pulse 89   Temp 98.6 F (37 C) (Oral)   Resp 16   Ht '5\' 3"'$  (1.6 m)   Wt 42.2 kg   SpO2 97%   BMI 16.47 kg/m  Gen:   Awake, no distress  NAD Resp:  Normal effort CTA MSK:   Moves extremities without difficulty  ENT:  Uvula is midline and tonsils are flat.  Patient with what appears to be some tonsillar stones to her very small stools bilaterally.  Medical Decision Making  Medically screening exam initiated at 5:55 PM.  Appropriate orders placed.  JANIKA JEDLICKA was informed that the remainder of the evaluation will be completed by another provider, this initial triage assessment does not replace that evaluation, and the importance of remaining in the ED until their evaluation is complete.  Patient to the ED for evaluation of sore throat.  Patient reports concern for abscess after being evaluated at a local urgent care.   Melvenia Needles, PA-C 08/09/22 1757
